# Patient Record
Sex: Female | Born: 1965 | Race: White | Hispanic: No | Marital: Married | Smoking: Never smoker
Health system: Southern US, Community
[De-identification: ages and names within clinical notes are randomized; demographics above are authoritative.]

## PROBLEM LIST (undated history)

## (undated) DIAGNOSIS — L719 Rosacea, unspecified: Secondary | ICD-10-CM

## (undated) DIAGNOSIS — H409 Unspecified glaucoma: Secondary | ICD-10-CM

## (undated) DIAGNOSIS — M1990 Unspecified osteoarthritis, unspecified site: Secondary | ICD-10-CM

## (undated) HISTORY — PX: NASAL SINUS SURGERY: SHX719

## (undated) HISTORY — PX: TUBAL LIGATION: SHX77

## (undated) HISTORY — PX: APPENDECTOMY: SHX54

---

## 2015-12-05 DIAGNOSIS — Z01419 Encounter for gynecological examination (general) (routine) without abnormal findings: Secondary | ICD-10-CM | POA: Insufficient documentation

## 2015-12-05 DIAGNOSIS — Z9889 Other specified postprocedural states: Secondary | ICD-10-CM | POA: Insufficient documentation

## 2015-12-05 DIAGNOSIS — Z6835 Body mass index (BMI) 35.0-35.9, adult: Secondary | ICD-10-CM | POA: Insufficient documentation

## 2016-06-12 DIAGNOSIS — Z8262 Family history of osteoporosis: Secondary | ICD-10-CM | POA: Insufficient documentation

## 2016-06-22 DIAGNOSIS — R102 Pelvic and perineal pain: Secondary | ICD-10-CM | POA: Insufficient documentation

## 2016-06-22 DIAGNOSIS — R519 Headache, unspecified: Secondary | ICD-10-CM | POA: Insufficient documentation

## 2016-06-22 DIAGNOSIS — R635 Abnormal weight gain: Secondary | ICD-10-CM | POA: Insufficient documentation

## 2016-09-17 HISTORY — PX: REDUCTION MAMMAPLASTY: SUR839

## 2017-01-17 ENCOUNTER — Other Ambulatory Visit: Payer: Self-pay | Admitting: Obstetrics & Gynecology

## 2017-01-17 DIAGNOSIS — N6489 Other specified disorders of breast: Secondary | ICD-10-CM

## 2017-01-21 ENCOUNTER — Other Ambulatory Visit: Payer: Self-pay | Admitting: Obstetrics & Gynecology

## 2017-01-21 ENCOUNTER — Ambulatory Visit
Admission: RE | Admit: 2017-01-21 | Discharge: 2017-01-21 | Disposition: A | Discharge status: Home / Self Care | Payer: Managed Care, Other (non HMO) | Source: Ambulatory Visit | Attending: Obstetrics & Gynecology | Admitting: Obstetrics & Gynecology

## 2017-01-21 DIAGNOSIS — N6489 Other specified disorders of breast: Secondary | ICD-10-CM

## 2017-02-26 ENCOUNTER — Other Ambulatory Visit: Payer: Managed Care, Other (non HMO)

## 2017-07-29 ENCOUNTER — Other Ambulatory Visit: Payer: Managed Care, Other (non HMO)

## 2017-07-29 ENCOUNTER — Ambulatory Visit: Payer: Managed Care, Other (non HMO)

## 2017-07-29 ENCOUNTER — Other Ambulatory Visit: Payer: Self-pay | Admitting: Obstetrics & Gynecology

## 2017-07-29 ENCOUNTER — Ambulatory Visit
Admission: RE | Admit: 2017-07-29 | Discharge: 2017-07-29 | Disposition: A | Discharge status: Home / Self Care | Payer: Managed Care, Other (non HMO) | Source: Ambulatory Visit | Attending: Obstetrics & Gynecology | Admitting: Obstetrics & Gynecology

## 2017-07-29 DIAGNOSIS — N6489 Other specified disorders of breast: Secondary | ICD-10-CM

## 2017-08-12 DIAGNOSIS — R928 Other abnormal and inconclusive findings on diagnostic imaging of breast: Secondary | ICD-10-CM | POA: Insufficient documentation

## 2017-08-15 ENCOUNTER — Ambulatory Visit: Payer: Self-pay | Admitting: Plastic Surgery

## 2017-08-22 ENCOUNTER — Encounter (HOSPITAL_BASED_OUTPATIENT_CLINIC_OR_DEPARTMENT_OTHER): Payer: Self-pay | Admitting: *Deleted

## 2017-08-28 ENCOUNTER — Other Ambulatory Visit: Payer: Self-pay

## 2017-08-28 ENCOUNTER — Ambulatory Visit (HOSPITAL_BASED_OUTPATIENT_CLINIC_OR_DEPARTMENT_OTHER): Payer: Managed Care, Other (non HMO) | Admitting: Certified Registered"

## 2017-08-28 ENCOUNTER — Ambulatory Visit (HOSPITAL_BASED_OUTPATIENT_CLINIC_OR_DEPARTMENT_OTHER)
Admission: RE | Admit: 2017-08-28 | Discharge: 2017-08-28 | Disposition: A | Discharge status: Home / Self Care | Payer: Managed Care, Other (non HMO) | Source: Ambulatory Visit | Attending: Plastic Surgery | Admitting: Plastic Surgery

## 2017-08-28 ENCOUNTER — Encounter (HOSPITAL_BASED_OUTPATIENT_CLINIC_OR_DEPARTMENT_OTHER)
Admission: RE | Disposition: A | Discharge status: Home / Self Care | Payer: Self-pay | Source: Ambulatory Visit | Attending: Plastic Surgery

## 2017-08-28 ENCOUNTER — Encounter (HOSPITAL_BASED_OUTPATIENT_CLINIC_OR_DEPARTMENT_OTHER): Payer: Self-pay | Admitting: Certified Registered"

## 2017-08-28 DIAGNOSIS — Z6834 Body mass index (BMI) 34.0-34.9, adult: Secondary | ICD-10-CM | POA: Insufficient documentation

## 2017-08-28 DIAGNOSIS — Z91018 Allergy to other foods: Secondary | ICD-10-CM | POA: Diagnosis not present

## 2017-08-28 DIAGNOSIS — N62 Hypertrophy of breast: Secondary | ICD-10-CM | POA: Insufficient documentation

## 2017-08-28 DIAGNOSIS — Z880 Allergy status to penicillin: Secondary | ICD-10-CM | POA: Insufficient documentation

## 2017-08-28 HISTORY — DX: Unspecified glaucoma: H40.9

## 2017-08-28 HISTORY — PX: BREAST REDUCTION SURGERY: SHX8

## 2017-08-28 HISTORY — PX: LIPOSUCTION: SHX10

## 2017-08-28 LAB — POCT HEMOGLOBIN-HEMACUE: HEMOGLOBIN: 15.2 g/dL — AB (ref 12.0–15.0)

## 2017-08-28 SURGERY — MAMMOPLASTY, REDUCTION
Anesthesia: General | Site: Breast | Laterality: Bilateral

## 2017-08-28 MED ORDER — BACITRACIN ZINC 500 UNIT/GM EX OINT
TOPICAL_OINTMENT | CUTANEOUS | Status: AC
Start: 2017-08-28 — End: ?
  Filled 2017-08-28: qty 28.35

## 2017-08-28 MED ORDER — PROPOFOL 10 MG/ML IV BOLUS
INTRAVENOUS | Status: DC | PRN
  Administered 2017-08-28: 200 mg via INTRAVENOUS

## 2017-08-28 MED ORDER — CEFAZOLIN SODIUM-DEXTROSE 2-4 GM/100ML-% IV SOLN
2.0000 g | INTRAVENOUS | Status: AC
  Administered 2017-08-28: 2 g via INTRAVENOUS

## 2017-08-28 MED ORDER — FENTANYL CITRATE (PF) 100 MCG/2ML IJ SOLN
50.0000 ug | INTRAMUSCULAR | Status: AC | PRN
  Administered 2017-08-28: 50 ug via INTRAVENOUS
  Administered 2017-08-28: 25 ug via INTRAVENOUS
  Administered 2017-08-28: 50 ug via INTRAVENOUS
  Administered 2017-08-28: 25 ug via INTRAVENOUS
  Administered 2017-08-28: 100 ug via INTRAVENOUS
  Administered 2017-08-28: 50 ug via INTRAVENOUS

## 2017-08-28 MED ORDER — ROCURONIUM BROMIDE 10 MG/ML (PF) SYRINGE
PREFILLED_SYRINGE | INTRAVENOUS | Status: AC
  Filled 2017-08-28: qty 10

## 2017-08-28 MED ORDER — BUPIVACAINE HCL (PF) 0.5 % IJ SOLN
INTRAMUSCULAR | Status: AC
  Filled 2017-08-28: qty 30

## 2017-08-28 MED ORDER — CEFAZOLIN SODIUM-DEXTROSE 2-4 GM/100ML-% IV SOLN
INTRAVENOUS | Status: AC
  Filled 2017-08-28: qty 100

## 2017-08-28 MED ORDER — FENTANYL CITRATE (PF) 100 MCG/2ML IJ SOLN
INTRAMUSCULAR | Status: AC
  Filled 2017-08-28: qty 2

## 2017-08-28 MED ORDER — METOCLOPRAMIDE HCL 5 MG/ML IJ SOLN: 10.0000 mg | Freq: Once | INTRAMUSCULAR | Status: DC | PRN

## 2017-08-28 MED ORDER — LACTATED RINGERS IV SOLN
INTRAVENOUS | Status: DC
  Administered 2017-08-28 (×4): via INTRAVENOUS

## 2017-08-28 MED ORDER — ONDANSETRON HCL 4 MG/2ML IJ SOLN
INTRAMUSCULAR | Status: DC | PRN
  Administered 2017-08-28: 4 mg via INTRAVENOUS

## 2017-08-28 MED ORDER — EPHEDRINE 5 MG/ML INJ
INTRAVENOUS | Status: AC
  Filled 2017-08-28: qty 10

## 2017-08-28 MED ORDER — SODIUM CHLORIDE 0.9 % IJ SOLN
INTRAMUSCULAR | Status: DC | PRN
  Administered 2017-08-28: 50 mL via INTRAVENOUS

## 2017-08-28 MED ORDER — CHLORHEXIDINE GLUCONATE CLOTH 2 % EX PADS: 6.0000 | MEDICATED_PAD | Freq: Once | CUTANEOUS | Status: DC

## 2017-08-28 MED ORDER — LIDOCAINE-EPINEPHRINE 1 %-1:100000 IJ SOLN
INTRAMUSCULAR | Status: AC
  Filled 2017-08-28: qty 2

## 2017-08-28 MED ORDER — LIDOCAINE HCL (PF) 1 % IJ SOLN
INTRAMUSCULAR | Status: AC
  Filled 2017-08-28: qty 30

## 2017-08-28 MED ORDER — DEXAMETHASONE SODIUM PHOSPHATE 10 MG/ML IJ SOLN
INTRAMUSCULAR | Status: AC
  Filled 2017-08-28: qty 2

## 2017-08-28 MED ORDER — FENTANYL CITRATE (PF) 100 MCG/2ML IJ SOLN
25.0000 ug | INTRAMUSCULAR | Status: DC | PRN
  Administered 2017-08-28 (×2): 25 ug via INTRAVENOUS

## 2017-08-28 MED ORDER — PHENYLEPHRINE 40 MCG/ML (10ML) SYRINGE FOR IV PUSH (FOR BLOOD PRESSURE SUPPORT)
PREFILLED_SYRINGE | INTRAVENOUS | Status: AC
  Filled 2017-08-28: qty 10

## 2017-08-28 MED ORDER — SCOPOLAMINE 1 MG/3DAYS TD PT72: 1.0000 | MEDICATED_PATCH | Freq: Once | TRANSDERMAL | Status: DC | PRN

## 2017-08-28 MED ORDER — MEPERIDINE HCL 25 MG/ML IJ SOLN: 6.2500 mg | INTRAMUSCULAR | Status: DC | PRN

## 2017-08-28 MED ORDER — SODIUM CHLORIDE 0.9 % IJ SOLN
INTRAMUSCULAR | Status: DC | PRN
  Administered 2017-08-28: 60 mL

## 2017-08-28 MED ORDER — MIDAZOLAM HCL 2 MG/2ML IJ SOLN
INTRAMUSCULAR | Status: AC
  Filled 2017-08-28: qty 2

## 2017-08-28 MED ORDER — SODIUM CHLORIDE 0.9 % IJ SOLN
INTRAMUSCULAR | Status: AC
  Filled 2017-08-28: qty 10

## 2017-08-28 MED ORDER — SUGAMMADEX SODIUM 200 MG/2ML IV SOLN
INTRAVENOUS | Status: AC
  Filled 2017-08-28: qty 2

## 2017-08-28 MED ORDER — BUPIVACAINE LIPOSOME 1.3 % IJ SUSP
INTRAMUSCULAR | Status: AC
  Filled 2017-08-28: qty 20

## 2017-08-28 MED ORDER — MIDAZOLAM HCL 2 MG/2ML IJ SOLN
1.0000 mg | INTRAMUSCULAR | Status: DC | PRN
  Administered 2017-08-28: 2 mg via INTRAVENOUS

## 2017-08-28 MED ORDER — ARTIFICIAL TEARS OPHTHALMIC OINT
TOPICAL_OINTMENT | OPHTHALMIC | Status: AC
  Filled 2017-08-28: qty 10.5

## 2017-08-28 MED ORDER — BACITRACIN ZINC 500 UNIT/GM EX OINT
TOPICAL_OINTMENT | CUTANEOUS | Status: DC | PRN
  Administered 2017-08-28: 1 via TOPICAL

## 2017-08-28 MED ORDER — ONDANSETRON HCL 4 MG/2ML IJ SOLN
INTRAMUSCULAR | Status: AC
  Filled 2017-08-28: qty 8

## 2017-08-28 MED ORDER — LACTATED RINGERS IV SOLN: INTRAVENOUS | Status: DC

## 2017-08-28 MED ORDER — EPHEDRINE SULFATE 50 MG/ML IJ SOLN
INTRAMUSCULAR | Status: DC | PRN
  Administered 2017-08-28 (×3): 10 mg via INTRAVENOUS

## 2017-08-28 MED ORDER — ROCURONIUM BROMIDE 100 MG/10ML IV SOLN
INTRAVENOUS | Status: DC | PRN
  Administered 2017-08-28: 60 mg via INTRAVENOUS

## 2017-08-28 MED ORDER — BUPIVACAINE HCL (PF) 0.25 % IJ SOLN
INTRAMUSCULAR | Status: DC | PRN
  Administered 2017-08-28: 30 mL

## 2017-08-28 MED ORDER — EPINEPHRINE 30 MG/30ML IJ SOLN
INTRAMUSCULAR | Status: AC
  Filled 2017-08-28: qty 1

## 2017-08-28 MED ORDER — BUPIVACAINE HCL (PF) 0.25 % IJ SOLN
INTRAMUSCULAR | Status: AC
  Filled 2017-08-28: qty 30

## 2017-08-28 MED ORDER — LIDOCAINE-EPINEPHRINE 1 %-1:100000 IJ SOLN
INTRAMUSCULAR | Status: DC | PRN
  Administered 2017-08-28: 20 mL

## 2017-08-28 MED ORDER — SODIUM BICARBONATE 4 % IV SOLN
INTRAVENOUS | Status: DC | PRN
  Administered 2017-08-28: 1500 mL via INTRAMUSCULAR

## 2017-08-28 MED ORDER — SUGAMMADEX SODIUM 200 MG/2ML IV SOLN
INTRAVENOUS | Status: DC | PRN
  Administered 2017-08-28: 200 mg via INTRAVENOUS

## 2017-08-28 MED ORDER — LIDOCAINE 2% (20 MG/ML) 5 ML SYRINGE
INTRAMUSCULAR | Status: AC
  Filled 2017-08-28: qty 25

## 2017-08-28 MED ORDER — LIDOCAINE HCL (CARDIAC) 20 MG/ML IV SOLN
INTRAVENOUS | Status: DC | PRN
  Administered 2017-08-28: 60 mg via INTRAVENOUS

## 2017-08-28 MED ORDER — DEXAMETHASONE SODIUM PHOSPHATE 4 MG/ML IJ SOLN
INTRAMUSCULAR | Status: DC | PRN
  Administered 2017-08-28: 10 mg via INTRAVENOUS

## 2017-08-28 MED ORDER — PROPOFOL 500 MG/50ML IV EMUL
INTRAVENOUS | Status: AC
  Filled 2017-08-28: qty 100

## 2017-08-28 MED ORDER — PHENYLEPHRINE 40 MCG/ML (10ML) SYRINGE FOR IV PUSH (FOR BLOOD PRESSURE SUPPORT)
PREFILLED_SYRINGE | INTRAVENOUS | Status: AC
  Filled 2017-08-28: qty 20

## 2017-08-28 SURGICAL SUPPLY — 64 items
BAG DECANTER FOR FLEXI CONT (MISCELLANEOUS) ×3 IMPLANT
BENZOIN TINCTURE PRP APPL 2/3 (GAUZE/BANDAGES/DRESSINGS) ×6 IMPLANT
BLADE KNIFE PERSONA 10 (BLADE) ×12 IMPLANT
BLADE KNIFE PERSONA 15 (BLADE) ×9 IMPLANT
BNDG GAUZE ELAST 4 BULKY (GAUZE/BANDAGES/DRESSINGS) ×6 IMPLANT
CANISTER SUCT 1200ML W/VALVE (MISCELLANEOUS) ×3 IMPLANT
CAP BOUFFANT 24 BLUE NURSES (PROTECTIVE WEAR) ×3 IMPLANT
CLOSURE STERI-STRIP 1/2X4 (GAUZE/BANDAGES/DRESSINGS) ×2
CLOSURE WOUND 1/2 X4 (GAUZE/BANDAGES/DRESSINGS) ×4
CLSR STERI-STRIP ANTIMIC 1/2X4 (GAUZE/BANDAGES/DRESSINGS) ×4 IMPLANT
COVER BACK TABLE 60X90IN (DRAPES) ×3 IMPLANT
COVER MAYO STAND STRL (DRAPES) ×3 IMPLANT
DECANTER SPIKE VIAL GLASS SM (MISCELLANEOUS) ×6 IMPLANT
DRAIN CHANNEL 10F 3/8 F FF (DRAIN) ×6 IMPLANT
DRAPE LAPAROSCOPIC ABDOMINAL (DRAPES) ×3 IMPLANT
DRAPE U-SHAPE 76X120 STRL (DRAPES) ×6 IMPLANT
DRSG EMULSION OIL 3X3 NADH (GAUZE/BANDAGES/DRESSINGS) ×6 IMPLANT
DRSG PAD ABDOMINAL 8X10 ST (GAUZE/BANDAGES/DRESSINGS) ×6 IMPLANT
ELECT REM PT RETURN 9FT ADLT (ELECTROSURGICAL) ×3
ELECTRODE REM PT RTRN 9FT ADLT (ELECTROSURGICAL) ×1 IMPLANT
EVACUATOR SILICONE 100CC (DRAIN) ×6 IMPLANT
FILTER 7/8 IN (FILTER) IMPLANT
GAUZE SPONGE 4X4 12PLY STRL (GAUZE/BANDAGES/DRESSINGS) ×6 IMPLANT
GLOVE BIO SURGEON STRL SZ 6.5 (GLOVE) ×6 IMPLANT
GLOVE BIO SURGEON STRL SZ7 (GLOVE) ×6 IMPLANT
GLOVE BIO SURGEONS STRL SZ 6.5 (GLOVE) ×3
GOWN STRL REUS W/ TWL LRG LVL3 (GOWN DISPOSABLE) ×2 IMPLANT
GOWN STRL REUS W/TWL LRG LVL3 (GOWN DISPOSABLE) ×4
IV NS 250ML (IV SOLUTION) ×2
IV NS 250ML BAXH (IV SOLUTION) ×1 IMPLANT
NDL SAFETY ECLIPSE 18X1.5 (NEEDLE) ×1 IMPLANT
NEEDLE HYPO 18GX1.5 SHARP (NEEDLE) ×2
NEEDLE HYPO 25X1 1.5 SAFETY (NEEDLE) ×9 IMPLANT
NEEDLE SPNL 18GX3.5 QUINCKE PK (NEEDLE) ×3 IMPLANT
NS IRRIG 1000ML POUR BTL (IV SOLUTION) ×6 IMPLANT
PACK BASIN DAY SURGERY FS (CUSTOM PROCEDURE TRAY) ×3 IMPLANT
PIN SAFETY STERILE (MISCELLANEOUS) ×3 IMPLANT
SCRUB TECHNI CARE 4 OZ NO DYE (MISCELLANEOUS) ×3 IMPLANT
SLEEVE SCD COMPRESS KNEE MED (MISCELLANEOUS) ×3 IMPLANT
SPECIMEN JAR MEDIUM (MISCELLANEOUS) ×6 IMPLANT
SPECIMEN JAR X LARGE (MISCELLANEOUS) IMPLANT
SPONGE LAP 18X18 X RAY DECT (DISPOSABLE) ×15 IMPLANT
STAPLER VISISTAT 35W (STAPLE) ×6 IMPLANT
STRIP CLOSURE SKIN 1/2X4 (GAUZE/BANDAGES/DRESSINGS) ×8 IMPLANT
SUT ETHILON 3 0 PS 1 (SUTURE) ×3 IMPLANT
SUT MNCRL AB 3-0 PS2 18 (SUTURE) ×6 IMPLANT
SUT MNCRL AB 4-0 PS2 18 (SUTURE) ×6 IMPLANT
SUT MON AB 5-0 PS2 18 (SUTURE) ×6 IMPLANT
SUT PROLENE 2 0 CT2 30 (SUTURE) ×3 IMPLANT
SUT PROLENE 3 0 PS 1 (SUTURE) ×6 IMPLANT
SUT VLOC 90 P-14 23 (SUTURE) ×6 IMPLANT
SYR BULB IRRIGATION 50ML (SYRINGE) ×6 IMPLANT
SYR CONTROL 10ML LL (SYRINGE) ×6 IMPLANT
TAPE MEASURE VINYL STERILE (MISCELLANEOUS) ×3 IMPLANT
TOWEL OR 17X24 6PK STRL BLUE (TOWEL DISPOSABLE) ×9 IMPLANT
TOWEL OR NON WOVEN STRL DISP B (DISPOSABLE) IMPLANT
TRAY DSU PREP LF (CUSTOM PROCEDURE TRAY) ×3 IMPLANT
TRAY FOLEY BAG SILVER LF 14FR (SET/KITS/TRAYS/PACK) ×3 IMPLANT
TRAY FOLEY BAG SILVER LF 16FR (SET/KITS/TRAYS/PACK) IMPLANT
TUBE CONNECTING 20'X1/4 (TUBING) ×1
TUBE CONNECTING 20X1/4 (TUBING) ×2 IMPLANT
UNDERPAD 30X30 (UNDERPADS AND DIAPERS) ×6 IMPLANT
VAC PENCILS W/TUBING CLEAR (MISCELLANEOUS) ×3 IMPLANT
YANKAUER SUCT BULB TIP NO VENT (SUCTIONS) ×3 IMPLANT

## 2017-08-28 NOTE — Transfer of Care (Signed)
Immediate Anesthesia Transfer of Care Note  Patient: Kimberly Brewer  Procedure(s) Performed: BILATERAL MAMMARY REDUCTION  (BREAST) (Bilateral Breast) LIPOSUCTION (Bilateral Breast)  Patient Location: PACU  Anesthesia Type:General  Level of Consciousness: awake, alert , oriented and patient cooperative  Airway & Oxygen Therapy: Patient Spontanous Breathing and Patient connected to face mask oxygen  Post-op Assessment: Report given to RN and Post -op Vital signs reviewed and stable  Post vital signs: Reviewed and stable  Last Vitals:  Vitals:   08/28/17 0743  BP: 124/73  Pulse: 66  Resp: 18  Temp: 36.5 C  SpO2: 98%    Last Pain:  Vitals:   08/28/17 0743  TempSrc: Oral         Complications: No apparent anesthesia complications

## 2017-08-28 NOTE — Anesthesia Preprocedure Evaluation (Addendum)
Anesthesia Evaluation  Patient identified by MRN, date of birth, ID band Patient awake    Reviewed: Allergy & Precautions, NPO status , Patient's Chart, lab work & pertinent test results  Airway Mallampati: II  TM Distance: >3 FB Neck ROM: Full    Dental no notable dental hx.    Pulmonary neg pulmonary ROS,    Pulmonary exam normal breath sounds clear to auscultation       Cardiovascular negative cardio ROS Normal cardiovascular exam Rhythm:Regular Rate:Normal     Neuro/Psych negative neurological ROS  negative psych ROS   GI/Hepatic negative GI ROS, Neg liver ROS,   Endo/Other  Morbid obesity  Renal/GU negative Renal ROS  negative genitourinary   Musculoskeletal negative musculoskeletal ROS (+)   Abdominal   Peds negative pediatric ROS (+)  Hematology negative hematology ROS (+)   Anesthesia Other Findings   Reproductive/Obstetrics negative OB ROS                            Anesthesia Physical Anesthesia Plan  ASA: II  Anesthesia Plan: General   Post-op Pain Management:    Induction: Intravenous  PONV Risk Score and Plan: 3  Airway Management Planned: LMA and Oral ETT  Additional Equipment:   Intra-op Plan:   Post-operative Plan: Extubation in OR  Informed Consent: I have reviewed the patients History and Physical, chart, labs and discussed the procedure including the risks, benefits and alternatives for the proposed anesthesia with the patient or authorized representative who has indicated his/her understanding and acceptance.   Dental advisory given  Plan Discussed with: CRNA  Anesthesia Plan Comments:         Anesthesia Quick Evaluation

## 2017-08-28 NOTE — Brief Op Note (Signed)
08/28/2017  2:00 PM  PATIENT:  Kimberly Brewer  51 y.o. female  PRE-OPERATIVE DIAGNOSIS:  BILATERAL MACROMASTIA  POST-OPERATIVE DIAGNOSIS:  BILATERAL MACROMASTIA  PROCEDURE:  Procedure(s): BILATERAL MAMMARY REDUCTION  (BREAST) (Bilateral) LIPOSUCTION (Bilateral)  SURGEON:  Surgeon(s) and Role:    * Contogiannis, Chales AbrahamsMary Ann, MD - Primary   ANESTHESIA:   general  EBL:  175 mL   BLOOD ADMINISTERED:none  DRAINS: (65F) Jackson-Pratt drain(s) with closed bulb suction in the Bilateral Breasts   LOCAL MEDICATIONS USED:  1.35 Exparel (266 mgs.total)  SPECIMEN:  Source of Specimen:  Bilateral Breasts  DISPOSITION OF SPECIMEN:  PATHOLOGY  COUNTS:  YES  DICTATION: .Note written in EPIC  PLAN OF CARE: Discharge to home after PACU  PATIENT DISPOSITION:  PACU - hemodynamically stable.   Delay start of Pharmacological VTE agent (>24hrs) due to surgical blood loss or risk of bleeding: not applicable

## 2017-08-28 NOTE — Discharge Instructions (Signed)
1. No lifting greater than 5 lbs with arms for 4 weeks. 2. Empty, strip, record and reactivate JP drains 3 times a day. 3. Percocet 5/325 mg tabs 1-2 tabs po q 4-6 hours prn pain- prescription given in office. 4. Duricef 1 tab po bid- prescription given in office. 5. Sterapred dose pack as directed- prescription given in office. 6. Follow-up appointment in a few days in office.    Post Anesthesia Home Care Instructions  Activity: Get plenty of rest for the remainder of the day. A responsible individual must stay with you for 24 hours following the procedure.  For the next 24 hours, DO NOT: -Drive a car -Advertising copywriterperate machinery -Drink alcoholic beverages -Take any medication unless instructed by your physician -Make any legal decisions or sign important papers.  Meals: Start with liquid foods such as gelatin or soup. Progress to regular foods as tolerated. Avoid greasy, spicy, heavy foods. If nausea and/or vomiting occur, drink only clear liquids until the nausea and/or vomiting subsides. Call your physician if vomiting continues.  Special Instructions/Symptoms: Your throat may feel dry or sore from the anesthesia or the breathing tube placed in your throat during surgery. If this causes discomfort, gargle with warm salt water. The discomfort should disappear within 24 hours.  If you had a scopolamine patch placed behind your ear for the management of post- operative nausea and/or vomiting:  1. The medication in the patch is effective for 72 hours, after which it should be removed.  Wrap patch in a tissue and discard in the trash. Wash hands thoroughly with soap and water. 2. You may remove the patch earlier than 72 hours if you experience unpleasant side effects which may include dry mouth, dizziness or visual disturbances. 3. Avoid touching the patch. Wash your hands with soap and water after contact with the patch.  About my Jackson-Pratt Bulb Drain  What is a Jackson-Pratt bulb? A  Jackson-Pratt is a soft, round device used to collect drainage. It is connected to a long, thin drainage catheter, which is held in place by one or two small stiches near your surgical incision site. When the bulb is squeezed, it forms a vacuum, forcing the drainage to empty into the bulb.  Emptying the Jackson-Pratt bulb- To empty the bulb: 1. Release the plug on the top of the bulb. 2. Pour the bulb's contents into a measuring container which your nurse will provide. 3. Record the time emptied and amount of drainage. Empty the drain(s) as often as your     doctor or nurse recommends.  Date                  Time                    Amount (Drain 1)                 Amount (Drain 2)  _____________________________________________________________________  _____________________________________________________________________  _____________________________________________________________________  _____________________________________________________________________  _____________________________________________________________________  _____________________________________________________________________  _____________________________________________________________________  _____________________________________________________________________  Squeezing the Jackson-Pratt Bulb- To squeeze the bulb: 1. Make sure the plug at the top of the bulb is open. 2. Squeeze the bulb tightly in your fist. You will hear air squeezing from the bulb. 3. Replace the plug while the bulb is squeezed. 4. Use a safety pin to attach the bulb to your clothing. This will keep the catheter from     pulling at the bulb insertion site.  When to call your doctor- Call your doctor if:  Drain site becomes red, swollen or hot.  You have a fever greater than 101 degrees F.  There is oozing at the drain site.  Drain falls out (apply a guaze bandage over the drain hole and secure it with tape).  Drainage  increases daily not related to activity patterns. (You will usually have more drainage when you are active than when you are resting.)  Drainage has a bad odor.

## 2017-08-28 NOTE — H&P (Signed)
  H&P faxed to surgical center.  -History and Physical Reviewed  -Patient has been re-examined  -No change in the plan of care  Tamaka Sawin A    

## 2017-08-28 NOTE — Anesthesia Postprocedure Evaluation (Signed)
Anesthesia Post Note  Patient: Marval RegalSharon Horney  Procedure(s) Performed: BILATERAL MAMMARY REDUCTION  (BREAST) (Bilateral Breast) LIPOSUCTION (Bilateral Breast)     Patient location during evaluation: PACU Anesthesia Type: General Level of consciousness: awake and alert Pain management: pain level controlled Vital Signs Assessment: post-procedure vital signs reviewed and stable Respiratory status: spontaneous breathing, nonlabored ventilation, respiratory function stable and patient connected to nasal cannula oxygen Cardiovascular status: blood pressure returned to baseline and stable Postop Assessment: no apparent nausea or vomiting Anesthetic complications: no    Last Vitals:  Vitals:   08/28/17 1500 08/28/17 1515  BP: 116/61 118/63  Pulse: 98 95  Resp: 15 17  Temp:    SpO2: 98% 96%    Last Pain:  Vitals:   08/28/17 1500  TempSrc:   PainSc: Asleep                 Phillips Groutarignan, Audreanna Torrisi

## 2017-08-28 NOTE — Anesthesia Procedure Notes (Signed)
Procedure Name: Intubation Date/Time: 08/28/2017 8:50 AM Performed by: Signe Colt, CRNA Pre-anesthesia Checklist: Patient identified, Emergency Drugs available, Suction available and Patient being monitored Patient Re-evaluated:Patient Re-evaluated prior to induction Oxygen Delivery Method: Circle system utilized Preoxygenation: Pre-oxygenation with 100% oxygen Induction Type: IV induction Ventilation: Mask ventilation without difficulty Laryngoscope Size: Mac and 3 Grade View: Grade I Tube type: Oral Tube size: 7.0 mm Number of attempts: 1 Airway Equipment and Method: Stylet and Oral airway Placement Confirmation: ETT inserted through vocal cords under direct vision,  positive ETCO2 and breath sounds checked- equal and bilateral Secured at: 21 cm Tube secured with: Tape Dental Injury: Teeth and Oropharynx as per pre-operative assessment  Comments: Airway assessment - receeding chin, narrow palate, native teeth - DL x 1 with Mac 3 grade ! view

## 2017-08-28 NOTE — Op Note (Signed)
OPERATIVE REPORT  08/28/2017  Kimberly Brewer  PREOPERATIVE DIAGNOSIS:  Bilateral macromastia.  POSTOPERATIVE DIAGNOSIS:  Bilateral macromastia.  PROCEDURE:  Bilateral reduction mammoplasties.  ATTENDING SURGEON:  Kimberly LevelsMary Ann Athanasius Kesling, MD  ANESTHESIA:  General.  ANESTHESIOLOGIST:  , MD  COMPLICATIONS:  None.  INDICATIONS FOR THE PROCEDURE:  The patient is a 51 y.o. female who has bilateral macromastia that is clinically symptomatic.  She presents to undergo bilateral reduction mammoplasties.  DESCRIPTION OF PROCEDURE:  The patient was marked in preop holding area in a pattern of Wise for the future bilateral reduction mammoplasties. She was then taken back to the OR, placed on the table in supine position.  After adequate general anesthesia was obtained, the patient's chest was prepped with Techni-Care and draped in sterile fashion.  The bases of the breasts have been infiltrated with 1% lidocaine with epinephrine.  After adequate hemostasis and anesthesia taken effect, the procedure was begun.  Both of the breast reductions were performed in the following similar manner.  The nipple-areolar complex was marked with a 45-mm nipple marker.  The skin was then incised and deepithelialized around the nipple-areolar complex down to the inframammary crease in the inferior pedicle pattern.  Next, the medial, superior, and lateral skin flaps were elevated down to the chest wall.  Excess fat and glandular tissue removed from the inferior pedicle.  The nipple-areolar complex was examined and found to be pink and viable. Power-assisted liposuction of the lateral breasts was performed to remove the excess breast and not have to extend the [facility] incision. The amounts are as noted on the accompanying nurses notes and the tissue was included with each specimen as appropriate. The wound was irrigated with saline irrigation.  Meticulous hemostasis was obtained with the Bovie electrocautery.   Inferior pedicle was centralized using 3-0 Prolene suture.  A #10 JP flat fully fluted drain was placed into the wound. The skin flaps were brought together at the inverted T junction with a 2- 0 Prolene suture.  The incisions were stapled for temporary closure. The breasts compared and found to have good shape and symmetry.  The incisions were then closed from the medial aspect of the JP drain to the medial aspect of the [facility] incision by first placing a few 3-0 Monocryl sutures to tack together the dermal layer, and then both the dermal and cuticular layer were closed in a single layer using a 2-0 Quill PDO barbed suture.  Lateral to the JP drain incision was closed using 3-0 Monocryl in the dermal layer, followed by 3-0 Monocryl running intracuticular stitch on the skin.  The vertical limb of the Wise pattern was closed in the dermal layer using 3-0 Monocryl suture.  The patient was placed in the upright position.  The future location of the nipple-areolar complexes was marked on both breast mounds using the 45-mm nipple marker.  She was then placed back in the recumbent position.  Both of the nipple areolar complexes were brought out onto the breast mounds in the following similar manner.  The skin was incised as marked and removed in full thickness into the subcutaneous tissues.  The nipple- areolar complex was examined, found to be pink and viable, then brought out through this aperture and sewn in place using 4-0 Monocryl in the dermal layer, followed by 5-0 Monocryl running intracuticular stitch on the skin.  This 5-0 Monocryl suture was then brought down to close the cuticular layer of the vertical limb as well.  The JP drain was  sewn in place using 3-0 nylon suture.  The pectoralis major muscle and fascia along with the breast and chest soft tissues were then infiltrated with 1% Exparel (total 266 mg).  Now the [facility] incision was also infiltrated with the Exparel in order to give  the patient postoperative pain control.  The incisions were dressed with benzoin, Steri-Strips, and the nipples dressed with bacitracin ointment and Adaptic.  4x4s were placed over the incisions and ABD pads in the axillary areas.  The patient was placed into a light postoperative support bra.  There were no complications. The patient tolerated the procedure well.  The final needle, sponge counts were reported to be correct at the end of the case.  The patient was then recovered without complications.  Both the patient and her family were given proper postoperative wound care instructions. She was then discharged home in the care of her family in stable condition.  Follow up will be with me in a few days in the office.         Kimberly LevelsMary Ann Durwood Brewer, M.D.  08/28/2017 2:03 PM

## 2017-08-29 ENCOUNTER — Encounter (HOSPITAL_BASED_OUTPATIENT_CLINIC_OR_DEPARTMENT_OTHER): Payer: Self-pay | Admitting: Plastic Surgery

## 2018-01-27 ENCOUNTER — Ambulatory Visit
Admission: RE | Admit: 2018-01-27 | Discharge: 2018-01-27 | Disposition: A | Discharge status: Home / Self Care | Payer: Managed Care, Other (non HMO) | Source: Ambulatory Visit | Attending: Obstetrics & Gynecology | Admitting: Obstetrics & Gynecology

## 2018-01-27 DIAGNOSIS — N6489 Other specified disorders of breast: Secondary | ICD-10-CM

## 2018-11-10 ENCOUNTER — Other Ambulatory Visit: Payer: Self-pay | Admitting: Obstetrics & Gynecology

## 2018-11-10 DIAGNOSIS — N6489 Other specified disorders of breast: Secondary | ICD-10-CM

## 2019-01-29 ENCOUNTER — Other Ambulatory Visit: Payer: Self-pay | Admitting: Obstetrics & Gynecology

## 2019-01-29 ENCOUNTER — Ambulatory Visit
Admission: RE | Admit: 2019-01-29 | Discharge: 2019-01-29 | Disposition: A | Discharge status: Home / Self Care | Payer: 59 | Source: Ambulatory Visit | Attending: Obstetrics & Gynecology | Admitting: Obstetrics & Gynecology

## 2019-01-29 ENCOUNTER — Other Ambulatory Visit: Payer: Self-pay

## 2019-01-29 DIAGNOSIS — R921 Mammographic calcification found on diagnostic imaging of breast: Secondary | ICD-10-CM

## 2019-01-29 DIAGNOSIS — N6489 Other specified disorders of breast: Secondary | ICD-10-CM

## 2019-01-29 DIAGNOSIS — N6320 Unspecified lump in the left breast, unspecified quadrant: Secondary | ICD-10-CM

## 2019-02-06 ENCOUNTER — Ambulatory Visit
Admission: RE | Admit: 2019-02-06 | Discharge: 2019-02-06 | Disposition: A | Discharge status: Home / Self Care | Payer: 59 | Source: Ambulatory Visit | Attending: Obstetrics & Gynecology | Admitting: Obstetrics & Gynecology

## 2019-02-06 ENCOUNTER — Other Ambulatory Visit: Payer: Self-pay | Admitting: Obstetrics & Gynecology

## 2019-02-06 ENCOUNTER — Other Ambulatory Visit: Payer: Self-pay

## 2019-02-06 DIAGNOSIS — N6320 Unspecified lump in the left breast, unspecified quadrant: Secondary | ICD-10-CM

## 2019-02-16 ENCOUNTER — Other Ambulatory Visit: Payer: Self-pay | Admitting: Surgery

## 2019-02-16 DIAGNOSIS — N6320 Unspecified lump in the left breast, unspecified quadrant: Secondary | ICD-10-CM

## 2019-02-20 ENCOUNTER — Other Ambulatory Visit: Payer: Self-pay | Admitting: Surgery

## 2019-02-20 DIAGNOSIS — N6320 Unspecified lump in the left breast, unspecified quadrant: Secondary | ICD-10-CM

## 2019-04-02 ENCOUNTER — Other Ambulatory Visit: Payer: Self-pay

## 2019-04-02 ENCOUNTER — Encounter (HOSPITAL_BASED_OUTPATIENT_CLINIC_OR_DEPARTMENT_OTHER): Payer: Self-pay

## 2019-04-06 ENCOUNTER — Other Ambulatory Visit (HOSPITAL_COMMUNITY)
Admission: RE | Admit: 2019-04-06 | Discharge: 2019-04-06 | Disposition: A | Discharge status: Home / Self Care | Payer: 59 | Source: Ambulatory Visit | Attending: Surgery | Admitting: Surgery

## 2019-04-06 DIAGNOSIS — Z1159 Encounter for screening for other viral diseases: Secondary | ICD-10-CM | POA: Diagnosis present

## 2019-04-06 DIAGNOSIS — N6320 Unspecified lump in the left breast, unspecified quadrant: Secondary | ICD-10-CM

## 2019-04-07 LAB — SARS CORONAVIRUS 2 (TAT 6-24 HRS): SARS Coronavirus 2: NEGATIVE

## 2019-04-08 ENCOUNTER — Ambulatory Visit
Admission: RE | Admit: 2019-04-08 | Discharge: 2019-04-08 | Disposition: A | Discharge status: Home / Self Care | Payer: 59 | Source: Ambulatory Visit | Attending: Surgery | Admitting: Surgery

## 2019-04-08 ENCOUNTER — Other Ambulatory Visit: Payer: Self-pay

## 2019-04-08 DIAGNOSIS — N6320 Unspecified lump in the left breast, unspecified quadrant: Secondary | ICD-10-CM

## 2019-04-08 NOTE — H&P (Signed)
Kimberly Brewer  [facility] Patient #: [id number] DOB: [birth year] Married / Language: English / Race: White Female   History of Present Illness The patient is a 53 year old female who presents with a breast mass. This is a pleasant 53 year old female referred by Dr. Malachi CarlJill Wagner after the recent diagnosis of a left breast mass seen on screening mammography. She had a previous bilateral breast reduction in December 2018. She underwent mammograms and ultrasounds in May of this year showing multiple benign calcifications in the right breast consistent with fat necrosis. There was, however, a focal spiculated mass measuring 1.1 cm in the outer mid to lower left breast which was also felt to be consistent with fat necrosis. She underwent a standard at a biopsy of this which was indeterminate. There were spindle cells present. This was felt to be discordant with the mammographic findings so removal of this area has been strongly recommended. She has had no previous history of breast cancer or the need for breast biopsies. There is no family history of breast cancer. She is otherwise healthy.   Past Surgical History Appendectomy  Breast Biopsy  Left. Mammoplasty; Reduction  Bilateral. Oral Surgery  Tonsillectomy   Diagnostic Studies History  Colonoscopy  5-10 years ago Mammogram  within last year Pap Smear  1-5 years ago  Allergies  Penicillins  Allergies Reconciled   Medication History (Sabrina Hills[place]anty, CMA;  Doxycycline Hyclate (20MG  Tablet, Oral) Active. Latanoprost (0.005% Solution, Ophthalmic) Active. Medications Reconciled  Social History [name]([name], CMA; 02/16/2019 2:07 PM) Caffeine use  Tea. No alcohol use  No drug use  Tobacco use  Never smoker.  Family History  Anesthetic complications  Mother. Arthritis  Father, Mother. Bleeding disorder  Mother. Heart Disease  Father. Hypertension  Mother. Kidney Disease   Mother. Melanoma  Mother.  Pregnancy / Birth History  Age at menarche  11 years. Age of menopause  1546-48 Contraceptive History  Oral contraceptives. Gravida  1 Length (months) of breastfeeding  3-6 Maternal age  55-35 Para  1 Regular periods     Review of Systems ( General Not Present- Appetite Loss, Chills, Fatigue, Fever, Night Sweats, Weight Gain and Weight Loss. Skin Not Present- Change in Wart/Mole, Dryness, Hives, Jaundice, New Lesions, Non-Healing Wounds, Rash and Ulcer. HEENT Present- Seasonal Allergies and Wears glasses/contact lenses. Not Present- Earache, Hearing Loss, Hoarseness, Nose Bleed, Oral Ulcers, Ringing in the Ears, Sinus Pain, Sore Throat, Visual Disturbances and Yellow Eyes. Respiratory Present- Snoring. Not Present- Bloody sputum, Chronic Cough, Difficulty Breathing and Wheezing. Breast Present- Breast Mass. Not Present- Breast Pain, Nipple Discharge and Skin Changes. Cardiovascular Not Present- Chest Pain, Difficulty Breathing Lying Down, Leg Cramps, Palpitations, Rapid Heart Rate, Shortness of Breath and Swelling of Extremities. Gastrointestinal Not Present- Abdominal Pain, Bloating, Bloody Stool, Change in Bowel Habits, Chronic diarrhea, Constipation, Difficulty Swallowing, Excessive gas, Gets full quickly at meals, Hemorrhoids, Indigestion, Nausea, Rectal Pain and Vomiting. Female Genitourinary Not Present- Frequency, Nocturia, Painful Urination, Pelvic Pain and Urgency. Neurological Not Present- Decreased Memory, Fainting, Headaches, Numbness, Seizures, Tingling, Tremor, Trouble walking and Weakness. Endocrine Not Present- Cold Intolerance, Excessive Hunger, Hair Changes, Heat Intolerance, Hot flashes and New Diabetes. Hematology Not Present- Blood Thinners, Easy Bruising, Excessive bleeding, Gland problems, HIV and Persistent Infections.  Vitals  Weight: 255 lb Height: 69in Body Surface Area: 2.29 m Body Mass Index: 37.66 kg/m  Temp.:  97.72F (Temporal)  Pulse: 117 (Regular)  BP: 188/92(Sitting, Left Arm, Standard)    Physical Exam  General Mental Status-Alert. General Appearance-Consistent with stated age. Hydration-Well hydrated. Voice-Normal.  Head and Neck Head-normocephalic, atraumatic with no lesions or palpable masses. Trachea-midline. Thyroid Gland Characteristics - normal size and consistency.  Eye Eyeball - Bilateral-Extraocular movements intact. Sclera/Conjunctiva - Bilateral-No scleral icterus.  Chest and Lung Exam Chest and lung exam reveals -quiet, even and easy respiratory effort with no use of accessory muscles and on auscultation, normal breath sounds, no adventitious sounds and normal vocal resonance. Inspection Chest Wall - Normal. Back - normal.  Breast Breast - Left-Symmetric, Non Tender, No Biopsy scars, no Dimpling, No Inflammation, No Lumpectomy scars, No Mastectomy scars, No Peau d' Orange. Breast - Right-Symmetric, Non Tender, No Biopsy scars, no Dimpling, No Inflammation, No Lumpectomy scars, No Mastectomy scars, No Peau d' Orange. Breast Lump-No Palpable Breast Mass. Note: There are bilateral reduction mammoplasty scars on her breasts. There are no palpable masses in either breast.   Cardiovascular Cardiovascular examination reveals -normal heart sounds, regular rate and rhythm with no murmurs and normal pedal pulses bilaterally.  Abdomen Inspection Inspection of the abdomen reveals - No Hernias. Skin - Scar - no surgical scars. Palpation/Percussion Palpation and Percussion of the abdomen reveal - Soft, Non Tender, No Rebound tenderness, No Rigidity (guarding) and No hepatosplenomegaly. Auscultation Auscultation of the abdomen reveals - Bowel sounds normal.  Neurologic - Did not examine.  Musculoskeletal - Did not examine.  Lymphatic Head & Neck  General Head & Neck Lymphatics: Bilateral - Description - Normal. Axillary  General  Axillary Region: Bilateral - Description - Normal. Tenderness - Non Tender. Femoral & Inguinal  Generalized Femoral & Inguinal Lymphatics: Bilateral - Description - Normal. Tenderness - Non Tender.    Assessment & Plan   LEFT BREAST MASS (N63.20)  Impression: I have reviewed the mammogram, ultrasound, and pathology results. I have given a copy of the pathology results with the patient. We discussed the mass seen in the left lower outer quadrant of the left breast. Because of the biopsy being discordant and the mammographic findings, removal of this area is recommended. I discussed proceeding with a left breast radioactive seed guided lumpectomy with her. I discussed the surgical procedure in detail. We discussed reasons for surgery. We discussed the risks which includes but is not limited to bleeding, infection, injury to surrounding structures, the need for further procedures if malignancy is found, cardiopulmonary issues, postoperative recovery, preoperative COVID testing, etc. She understands and wished to proceed with surgery which will be scheduled

## 2019-04-08 NOTE — Progress Notes (Signed)
Pt given CHG bath with instructions and given G2 gatorade and instructions to have completed by [place] DOS. NPO otherwise DOS. Pt verbalized understanding.

## 2019-04-09 ENCOUNTER — Other Ambulatory Visit: Payer: Self-pay

## 2019-04-09 ENCOUNTER — Encounter (HOSPITAL_BASED_OUTPATIENT_CLINIC_OR_DEPARTMENT_OTHER): Payer: Self-pay

## 2019-04-09 ENCOUNTER — Ambulatory Visit (HOSPITAL_BASED_OUTPATIENT_CLINIC_OR_DEPARTMENT_OTHER): Payer: 59 | Admitting: Certified Registered"

## 2019-04-09 ENCOUNTER — Encounter (HOSPITAL_BASED_OUTPATIENT_CLINIC_OR_DEPARTMENT_OTHER)
Admission: RE | Disposition: A | Discharge status: Home / Self Care | Payer: Self-pay | Source: Home / Self Care | Attending: Surgery

## 2019-04-09 ENCOUNTER — Ambulatory Visit (HOSPITAL_BASED_OUTPATIENT_CLINIC_OR_DEPARTMENT_OTHER)
Admission: RE | Admit: 2019-04-09 | Discharge: 2019-04-09 | Disposition: A | Discharge status: Home / Self Care | Payer: 59 | Attending: Surgery | Admitting: Surgery

## 2019-04-09 ENCOUNTER — Ambulatory Visit
Admission: RE | Admit: 2019-04-09 | Discharge: 2019-04-09 | Disposition: A | Discharge status: Home / Self Care | Payer: 59 | Source: Ambulatory Visit | Attending: Surgery | Admitting: Surgery

## 2019-04-09 DIAGNOSIS — N6323 Unspecified lump in the left breast, lower outer quadrant: Secondary | ICD-10-CM | POA: Insufficient documentation

## 2019-04-09 DIAGNOSIS — N6320 Unspecified lump in the left breast, unspecified quadrant: Secondary | ICD-10-CM

## 2019-04-09 DIAGNOSIS — Z880 Allergy status to penicillin: Secondary | ICD-10-CM | POA: Diagnosis not present

## 2019-04-09 DIAGNOSIS — Z792 Long term (current) use of antibiotics: Secondary | ICD-10-CM | POA: Insufficient documentation

## 2019-04-09 HISTORY — PX: BREAST LUMPECTOMY WITH RADIOACTIVE SEED LOCALIZATION: SHX6424

## 2019-04-09 HISTORY — DX: Rosacea, unspecified: L71.9

## 2019-04-09 SURGERY — BREAST LUMPECTOMY WITH RADIOACTIVE SEED LOCALIZATION
Anesthesia: General | Site: Breast | Laterality: Left

## 2019-04-09 MED ORDER — DEXAMETHASONE SODIUM PHOSPHATE 10 MG/ML IJ SOLN
INTRAMUSCULAR | Status: DC | PRN
  Administered 2019-04-09: 5 mg via INTRAVENOUS

## 2019-04-09 MED ORDER — BUPIVACAINE-EPINEPHRINE 0.5% -1:200000 IJ SOLN
INTRAMUSCULAR | Status: DC | PRN
  Administered 2019-04-09: 17 mL

## 2019-04-09 MED ORDER — CIPROFLOXACIN IN D5W 400 MG/200ML IV SOLN
INTRAVENOUS | Status: AC
  Filled 2019-04-09: qty 200

## 2019-04-09 MED ORDER — ONDANSETRON HCL 4 MG/2ML IJ SOLN
INTRAMUSCULAR | Status: DC | PRN
  Administered 2019-04-09: 4 mg via INTRAVENOUS

## 2019-04-09 MED ORDER — ACETAMINOPHEN 500 MG PO TABS
ORAL_TABLET | ORAL | Status: AC
  Filled 2019-04-09: qty 2

## 2019-04-09 MED ORDER — PHENYLEPHRINE 40 MCG/ML (10ML) SYRINGE FOR IV PUSH (FOR BLOOD PRESSURE SUPPORT)
PREFILLED_SYRINGE | INTRAVENOUS | Status: AC
  Filled 2019-04-09: qty 10

## 2019-04-09 MED ORDER — LACTATED RINGERS IV SOLN: INTRAVENOUS | Status: DC

## 2019-04-09 MED ORDER — MIDAZOLAM HCL 2 MG/2ML IJ SOLN
INTRAMUSCULAR | Status: AC
  Filled 2019-04-09: qty 2

## 2019-04-09 MED ORDER — PROPOFOL 10 MG/ML IV BOLUS
INTRAVENOUS | Status: AC
  Filled 2019-04-09: qty 20

## 2019-04-09 MED ORDER — FENTANYL CITRATE (PF) 100 MCG/2ML IJ SOLN: 25.0000 ug | INTRAMUSCULAR | Status: DC | PRN

## 2019-04-09 MED ORDER — CIPROFLOXACIN IN D5W 400 MG/200ML IV SOLN
400.0000 mg | INTRAVENOUS | Status: AC
  Administered 2019-04-09: 400 mg via INTRAVENOUS

## 2019-04-09 MED ORDER — ACETAMINOPHEN 500 MG PO TABS
1000.0000 mg | ORAL_TABLET | ORAL | Status: AC
  Administered 2019-04-09: 1000 mg via ORAL

## 2019-04-09 MED ORDER — LACTATED RINGERS IV SOLN
INTRAVENOUS | Status: DC
  Administered 2019-04-09: 08:00:00 via INTRAVENOUS

## 2019-04-09 MED ORDER — LIDOCAINE 2% (20 MG/ML) 5 ML SYRINGE
INTRAMUSCULAR | Status: AC
  Filled 2019-04-09: qty 5

## 2019-04-09 MED ORDER — FENTANYL CITRATE (PF) 100 MCG/2ML IJ SOLN
INTRAMUSCULAR | Status: AC
  Filled 2019-04-09: qty 2

## 2019-04-09 MED ORDER — SCOPOLAMINE 1 MG/3DAYS TD PT72
1.0000 | MEDICATED_PATCH | TRANSDERMAL | Status: DC
  Administered 2019-04-09: 1.5 mg via TRANSDERMAL

## 2019-04-09 MED ORDER — EPHEDRINE SULFATE-NACL 50-0.9 MG/10ML-% IV SOSY
PREFILLED_SYRINGE | INTRAVENOUS | Status: DC | PRN
  Administered 2019-04-09 (×2): 10 mg via INTRAVENOUS

## 2019-04-09 MED ORDER — CHLORHEXIDINE GLUCONATE CLOTH 2 % EX PADS: 6.0000 | MEDICATED_PAD | Freq: Once | CUTANEOUS | Status: DC

## 2019-04-09 MED ORDER — TRAMADOL HCL 50 MG PO TABS: 50.0000 mg | ORAL_TABLET | Freq: Four times a day (QID) | ORAL | 0 refills | Status: DC | PRN

## 2019-04-09 MED ORDER — LIDOCAINE 2% (20 MG/ML) 5 ML SYRINGE
INTRAMUSCULAR | Status: DC | PRN
  Administered 2019-04-09: 100 mg via INTRAVENOUS

## 2019-04-09 MED ORDER — GABAPENTIN 300 MG PO CAPS
ORAL_CAPSULE | ORAL | Status: AC
  Filled 2019-04-09: qty 1

## 2019-04-09 MED ORDER — DEXAMETHASONE SODIUM PHOSPHATE 10 MG/ML IJ SOLN
INTRAMUSCULAR | Status: AC
  Filled 2019-04-09: qty 1

## 2019-04-09 MED ORDER — EPHEDRINE 5 MG/ML INJ
INTRAVENOUS | Status: AC
  Filled 2019-04-09: qty 10

## 2019-04-09 MED ORDER — METOCLOPRAMIDE HCL 5 MG/ML IJ SOLN: 10.0000 mg | Freq: Once | INTRAMUSCULAR | Status: DC | PRN

## 2019-04-09 MED ORDER — PHENYLEPHRINE 40 MCG/ML (10ML) SYRINGE FOR IV PUSH (FOR BLOOD PRESSURE SUPPORT)
PREFILLED_SYRINGE | INTRAVENOUS | Status: DC | PRN
  Administered 2019-04-09 (×2): 120 ug via INTRAVENOUS
  Administered 2019-04-09 (×2): 80 ug via INTRAVENOUS

## 2019-04-09 MED ORDER — GABAPENTIN 300 MG PO CAPS
300.0000 mg | ORAL_CAPSULE | ORAL | Status: AC
  Administered 2019-04-09: 300 mg via ORAL

## 2019-04-09 MED ORDER — CELECOXIB 200 MG PO CAPS
ORAL_CAPSULE | ORAL | Status: AC
  Filled 2019-04-09: qty 1

## 2019-04-09 MED ORDER — PROPOFOL 10 MG/ML IV BOLUS
INTRAVENOUS | Status: DC | PRN
  Administered 2019-04-09: 50 mg via INTRAVENOUS
  Administered 2019-04-09: 200 mg via INTRAVENOUS

## 2019-04-09 MED ORDER — MEPERIDINE HCL 25 MG/ML IJ SOLN: 6.2500 mg | INTRAMUSCULAR | Status: DC | PRN

## 2019-04-09 MED ORDER — CELECOXIB 200 MG PO CAPS
200.0000 mg | ORAL_CAPSULE | ORAL | Status: AC
  Administered 2019-04-09: 200 mg via ORAL

## 2019-04-09 MED ORDER — MIDAZOLAM HCL 5 MG/5ML IJ SOLN
INTRAMUSCULAR | Status: DC | PRN
  Administered 2019-04-09: 2 mg via INTRAVENOUS

## 2019-04-09 MED ORDER — FENTANYL CITRATE (PF) 250 MCG/5ML IJ SOLN
INTRAMUSCULAR | Status: DC | PRN
  Administered 2019-04-09: 50 ug via INTRAVENOUS
  Administered 2019-04-09 (×2): 25 ug via INTRAVENOUS

## 2019-04-09 MED ORDER — SCOPOLAMINE 1 MG/3DAYS TD PT72
MEDICATED_PATCH | TRANSDERMAL | Status: AC
  Filled 2019-04-09: qty 1

## 2019-04-09 SURGICAL SUPPLY — 46 items
APPLIER CLIP 9.375 MED OPEN (MISCELLANEOUS)
BINDER BREAST XLRG (GAUZE/BANDAGES/DRESSINGS) ×3 IMPLANT
BLADE HEX COATED 2.75 (ELECTRODE) ×3 IMPLANT
BLADE SURG 15 STRL LF DISP TIS (BLADE) ×1 IMPLANT
BLADE SURG 15 STRL SS (BLADE) ×2
CANISTER SUC SOCK COL 7IN (MISCELLANEOUS) IMPLANT
CANISTER SUCT 1200ML W/VALVE (MISCELLANEOUS) IMPLANT
CHLORAPREP W/TINT 26 (MISCELLANEOUS) ×3 IMPLANT
CLIP APPLIE 9.375 MED OPEN (MISCELLANEOUS) IMPLANT
CLIP VESOCCLUDE SM WIDE 6/CT (CLIP) IMPLANT
COVER BACK TABLE REUSABLE LG (DRAPES) ×3 IMPLANT
COVER MAYO STAND REUSABLE (DRAPES) ×3 IMPLANT
COVER PROBE W GEL 5X96 (DRAPES) ×3 IMPLANT
COVER WAND RF STERILE (DRAPES) IMPLANT
DECANTER SPIKE VIAL GLASS SM (MISCELLANEOUS) ×3 IMPLANT
DERMABOND ADVANCED (GAUZE/BANDAGES/DRESSINGS) ×2
DERMABOND ADVANCED .7 DNX12 (GAUZE/BANDAGES/DRESSINGS) ×1 IMPLANT
DRAPE LAPAROSCOPIC ABDOMINAL (DRAPES) ×3 IMPLANT
DRAPE UTILITY XL STRL (DRAPES) ×3 IMPLANT
ELECT REM PT RETURN 9FT ADLT (ELECTROSURGICAL) ×3
ELECTRODE REM PT RTRN 9FT ADLT (ELECTROSURGICAL) ×1 IMPLANT
GAUZE SPONGE 4X4 12PLY STRL LF (GAUZE/BANDAGES/DRESSINGS) IMPLANT
GLOVE BIOGEL PI IND STRL 7.5 (GLOVE) ×1 IMPLANT
GLOVE BIOGEL PI INDICATOR 7.5 (GLOVE) ×2
GLOVE SURG SIGNA 7.5 PF LTX (GLOVE) ×3 IMPLANT
GOWN STRL REUS W/ TWL LRG LVL3 (GOWN DISPOSABLE) ×1 IMPLANT
GOWN STRL REUS W/ TWL XL LVL3 (GOWN DISPOSABLE) ×1 IMPLANT
GOWN STRL REUS W/TWL LRG LVL3 (GOWN DISPOSABLE) ×2
GOWN STRL REUS W/TWL XL LVL3 (GOWN DISPOSABLE) ×2
KIT MARKER MARGIN INK (KITS) ×3 IMPLANT
NEEDLE HYPO 25X1 1.5 SAFETY (NEEDLE) ×3 IMPLANT
NS IRRIG 1000ML POUR BTL (IV SOLUTION) IMPLANT
PACK BASIN DAY SURGERY FS (CUSTOM PROCEDURE TRAY) ×3 IMPLANT
PENCIL BUTTON HOLSTER BLD 10FT (ELECTRODE) ×3 IMPLANT
SLEEVE SCD COMPRESS KNEE MED (MISCELLANEOUS) ×3 IMPLANT
SPONGE LAP 4X18 RFD (DISPOSABLE) ×3 IMPLANT
SUT MNCRL AB 4-0 PS2 18 (SUTURE) ×3 IMPLANT
SUT SILK 2 0 SH (SUTURE) IMPLANT
SUT VIC AB 3-0 SH 27 (SUTURE) ×2
SUT VIC AB 3-0 SH 27X BRD (SUTURE) ×1 IMPLANT
SYR CONTROL 10ML LL (SYRINGE) ×3 IMPLANT
TOWEL GREEN STERILE FF (TOWEL DISPOSABLE) ×3 IMPLANT
TRAY FAXITRON CT DISP (TRAY / TRAY PROCEDURE) ×3 IMPLANT
TUBE CONNECTING 20'X1/4 (TUBING) ×1
TUBE CONNECTING 20X1/4 (TUBING) ×2 IMPLANT
YANKAUER SUCT BULB TIP NO VENT (SUCTIONS) ×3 IMPLANT

## 2019-04-09 NOTE — Anesthesia Procedure Notes (Signed)
Procedure Name: LMA Insertion Date/Time: 04/09/2019 8:40 AM Performed by: Myna Bright, CRNA Pre-anesthesia Checklist: Patient identified, Emergency Drugs available, Suction available and Patient being monitored Patient Re-evaluated:Patient Re-evaluated prior to induction Oxygen Delivery Method: Circle system utilized Preoxygenation: Pre-oxygenation with 100% oxygen Induction Type: IV induction Ventilation: Mask ventilation without difficulty LMA: LMA inserted LMA Size: 4.0 Tube type: Oral Number of attempts: 1 Placement Confirmation: positive ETCO2 and breath sounds checked- equal and bilateral Tube secured with: Tape Dental Injury: Teeth and Oropharynx as per pre-operative assessment

## 2019-04-09 NOTE — Anesthesia Preprocedure Evaluation (Signed)
Anesthesia Evaluation  Patient identified by MRN, date of birth, ID band Patient awake    Reviewed: Allergy & Precautions, NPO status , Patient's Chart, lab work & pertinent test results  History of Anesthesia Complications (+) PONV  Airway Mallampati: II  TM Distance: >3 FB Neck ROM: Full    Dental no notable dental hx.    Pulmonary neg pulmonary ROS,    Pulmonary exam normal breath sounds clear to auscultation       Cardiovascular negative cardio ROS Normal cardiovascular exam Rhythm:Regular Rate:Normal     Neuro/Psych negative neurological ROS  negative psych ROS   GI/Hepatic negative GI ROS, Neg liver ROS,   Endo/Other  negative endocrine ROS  Renal/GU negative Renal ROS  negative genitourinary   Musculoskeletal negative musculoskeletal ROS (+)   Abdominal   Peds negative pediatric ROS (+)  Hematology negative hematology ROS (+)   Anesthesia Other Findings   Reproductive/Obstetrics negative OB ROS                             Anesthesia Physical Anesthesia Plan  ASA: II  Anesthesia Plan: General   Post-op Pain Management:    Induction: Intravenous  PONV Risk Score and Plan: 4 or greater and Ondansetron, Dexamethasone, Midazolam, Scopolamine patch - Pre-op and Treatment may vary due to age or medical condition  Airway Management Planned: LMA  Additional Equipment:   Intra-op Plan:   Post-operative Plan: Extubation in OR  Informed Consent: I have reviewed the patients History and Physical, chart, labs and discussed the procedure including the risks, benefits and alternatives for the proposed anesthesia with the patient or authorized representative who has indicated his/her understanding and acceptance.     Dental advisory given  Plan Discussed with: CRNA  Anesthesia Plan Comments:         Anesthesia Quick Evaluation

## 2019-04-09 NOTE — Discharge Instructions (Signed)
°[facility] °Office Phone Number [phone] ° °BREAST BIOPSY/ PARTIAL MASTECTOMY: POST OP INSTRUCTIONS ° °Always review your discharge instruction sheet given to you by the facility where your surgery was performed. ° °IF YOU HAVE DISABILITY OR FAMILY LEAVE FORMS, YOU MUST BRING THEM TO THE OFFICE FOR PROCESSING.  DO NOT GIVE THEM TO YOUR DOCTOR. ° °1. A prescription for pain medication may be given to you upon discharge.  Take your pain medication as prescribed, if needed.  If narcotic pain medicine is not needed, then you may take acetaminophen (Tylenol) or ibuprofen (Advil) as needed. °2. Take your usually prescribed medications unless otherwise directed °3. If you need a refill on your pain medication, please contact your pharmacy.  They will contact our office to request authorization.  Prescriptions will not be filled after 5pm or on week-ends. °4. You should eat very light the first 24 hours after surgery, such as soup, crackers, pudding, etc.  Resume your normal diet the day after surgery. °5. Most patients will experience some swelling and bruising in the breast.  Ice packs and a good support bra will help.  Swelling and bruising can take several days to resolve.  °6. It is common to experience some constipation if taking pain medication after surgery.  Increasing fluid intake and taking a stool softener will usually help or prevent this problem from occurring.  A mild laxative (Milk of Magnesia or Miralax) should be taken according to package directions if there are no bowel movements after 48 hours. °7. Unless discharge instructions indicate otherwise, you may remove your bandages 24-48 hours after surgery, and you may shower at that time.  You may have steri-strips (small skin tapes) in place directly over the incision.  These strips should be left on the skin for 7-10 days.  If your surgeon used skin glue on the incision, you may shower in 24 hours.  The glue will flake off over the  next 2-3 weeks.  Any sutures or staples will be removed at the office during your follow-up visit. °8. ACTIVITIES:  You may resume regular daily activities (gradually increasing) beginning the next day.  Wearing a good support bra or sports bra minimizes pain and swelling.  You may have sexual intercourse when it is comfortable. °a. You may drive when you no longer are taking prescription pain medication, you can comfortably wear a seatbelt, and you can safely maneuver your car and apply brakes. °b. RETURN TO WORK:  ______________________________________________________________________________________ °9. You should see your doctor in the office for a follow-up appointment approximately two weeks after your surgery.  Your doctor’s nurse will typically make your follow-up appointment when she calls you with your pathology report.  Expect your pathology report 2-3 business days after your surgery.  You may call to check if you do not hear from us after three days. °10. OTHER INSTRUCTIONS:OK TO SHOWER STARTING TOMORROW °11. ICE PACK, TYLENOL, IBUPROFEN ALSO FOR PAIN °12. NO VIGOROUS ACTIVITY FOR ONE WEEK _______________________________________________________________________________________________ _____________________________________________________________________________________________________________________________________ °_____________________________________________________________________________________________________________________________________ °_____________________________________________________________________________________________________________________________________ ° °WHEN TO CALL YOUR DOCTOR: °1. Fever over 101.0 °2. Nausea and/or vomiting. °3. Extreme swelling or bruising. °4. Continued bleeding from incision. °5. Increased pain, redness, or drainage from the incision. ° °The clinic staff is available to answer your questions during regular business hours.  Please don’t hesitate to call  and ask to speak to one of the nurses for clinical concerns.  If you have a medical emergency, go to the nearest emergency room or call 911.  A surgeon   from [facility] is always on call at the hospital. ° °For further questions, please visit [email or web address]  ° ° ° ° ° °Post Anesthesia Home Care Instructions ° °Activity: °Get plenty of rest for the remainder of the day. A responsible individual must stay with you for 24 hours following the procedure.  °For the next 24 hours, DO NOT: °-Drive a car °-Operate machinery °-Drink alcoholic beverages °-Take any medication unless instructed by your physician °-Make any legal decisions or sign important papers. ° °Meals: °Start with liquid foods such as gelatin or soup. Progress to regular foods as tolerated. Avoid greasy, spicy, heavy foods. If nausea and/or vomiting occur, drink only clear liquids until the nausea and/or vomiting subsides. Call your physician if vomiting continues. ° °Special Instructions/Symptoms: °Your throat may feel dry or sore from the anesthesia or the breathing tube placed in your throat during surgery. If this causes discomfort, gargle with warm salt water. The discomfort should disappear within 24 hours. ° °If you had a scopolamine patch placed behind your ear for the management of post- operative nausea and/or vomiting: ° °1. The medication in the patch is effective for 72 hours, after which it should be removed.  Wrap patch in a tissue and discard in the trash. Wash hands thoroughly with soap and water. °2. You may remove the patch earlier than 72 hours if you experience unpleasant side effects which may include dry mouth, dizziness or visual disturbances. °3. Avoid touching the patch. Wash your hands with soap and water after contact with the patch. °   ° °

## 2019-04-09 NOTE — Interval H&P Note (Signed)
History and Physical Interval Note:no change in H and P  04/09/2019 8:07 AM  Kimberly Brewer  has presented today for surgery, with the diagnosis of LEFT BREAST MASS.  The various methods of treatment have been discussed with the patient and family. After consideration of risks, benefits and other options for treatment, the patient has consented to  Procedure(s): LEFT BREAST LUMPECTOMY WITH RADIOACTIVE SEED LOCALIZATION (Left) as a surgical intervention.  The patient's history has been reviewed, patient examined, no change in status, stable for surgery.  I have reviewed the patient's chart and labs.  Questions were answered to the patient's satisfaction.     Coralie Keens

## 2019-04-09 NOTE — Op Note (Signed)
LEFT BREAST LUMPECTOMY WITH RADIOACTIVE SEED LOCALIZATION  Procedure Note  Tony Friscia 04/09/2019   Pre-op Diagnosis: LEFT BREAST MASS     Post-op Diagnosis: same  Procedure(s): LEFT BREAST LUMPECTOMY WITH RADIOACTIVE SEED LOCALIZATION  Surgeon(s): Coralie Keens, MD  Anesthesia: General  Staff:  Circulator: Patric Dykes, RN Scrub Person: Paulette Blanch, RN  Estimated Blood Loss: Minimal               Specimens: sent to path  Indications: This is a 53 year old female has had bilateral breast reductions.  On her recent screening mammography, she had an area of abnormality.  She underwent a stereotactic biopsy which showed spindle cells.  This was felt to be discordant so a lumpectomy has been recommended  Procedure: The patient was brought to the operating room and identifies correct patient.  She was placed upon the operating table and general anesthesia was induced.  Her left breast was then prepped and draped in usual sterile fashion.  Identified the radioactive seed at the lower outer quadrant of the left breast near the areola.  I anesthetized skin with Marcaine and made a circumareolar incision with a scalpel.  I then dissected down to the deep breast tissue where the radioactive radioactive seed was located with the aid of the neoprobe.  I was then able to excise around the radioactive seed with aid of neoprobe removing the lumpectomy specimen.  Once the specimen was removed, I marked all margins with marker paint, x-rayed the specimen, and confirm the radioactive seed and previous marker were in the specimen.  This was then sent to pathology for evaluation.  I achieved hemostasis with the cautery and placed 2 surgical clips at the biopsy cavity.  I anesthetized the incision further with Marcaine.  I then closed the deep and subcutaneous tissue with interrupted 3-0 Vicryl sutures and closed the skin with a running 4-0 Monocryl.  Dermabond was then applied.  The patient  tolerated procedure well.  All the counts were correct at the end of the procedure.  The patient was then extubated in the operating room and taken in a stable condition to the recovery room.          Coralie Keens   Date: 04/09/2019  Time: 9:21 AM

## 2019-04-09 NOTE — Transfer of Care (Signed)
Immediate Anesthesia Transfer of Care Note  Patient: Kimberly Brewer  Procedure(s) Performed: LEFT BREAST LUMPECTOMY WITH RADIOACTIVE SEED LOCALIZATION (Left Breast)  Patient Location: PACU  Anesthesia Type:General  Level of Consciousness: awake, alert , oriented and patient cooperative  Airway & Oxygen Therapy: Patient Spontanous Breathing and Patient connected to face mask oxygen  Post-op Assessment: Report given to RN, Post -op Vital signs reviewed and stable and Patient moving all extremities  Post vital signs: Reviewed and stable  Last Vitals:  Vitals Value Taken Time  BP 109/60 04/09/19 0932  Temp    Pulse 81 04/09/19 0933  Resp 18 04/09/19 0933  SpO2 100 % 04/09/19 0933  Vitals shown include unvalidated device data.  Last Pain:  Vitals:   04/09/19 0725  TempSrc: Oral  PainSc: 0-No pain         Complications: No apparent anesthesia complications

## 2019-04-09 NOTE — Anesthesia Postprocedure Evaluation (Signed)
Anesthesia Post Note  Patient: Kimberly Brewer  Procedure(s) Performed: LEFT BREAST LUMPECTOMY WITH RADIOACTIVE SEED LOCALIZATION (Left Breast)     Patient location during evaluation: PACU Anesthesia Type: General Level of consciousness: awake and alert Pain management: pain level controlled Vital Signs Assessment: post-procedure vital signs reviewed and stable Respiratory status: spontaneous breathing, nonlabored ventilation, respiratory function stable and patient connected to nasal cannula oxygen Cardiovascular status: blood pressure returned to baseline and stable Postop Assessment: no apparent nausea or vomiting Anesthetic complications: no    Last Vitals:  Vitals:   04/09/19 1015 04/09/19 1046  BP: 114/81 130/70  Pulse: 70   Resp: 13 20  Temp:  36.6 C  SpO2: 97% 97%    Last Pain:  Vitals:   04/09/19 1046  TempSrc: Oral  PainSc: 0-No pain                 Montez Hageman

## 2019-04-10 ENCOUNTER — Encounter (HOSPITAL_BASED_OUTPATIENT_CLINIC_OR_DEPARTMENT_OTHER): Payer: Self-pay | Admitting: Surgery

## 2019-06-10 ENCOUNTER — Other Ambulatory Visit: Payer: Self-pay

## 2019-06-10 ENCOUNTER — Other Ambulatory Visit: Payer: Self-pay | Admitting: Sports Medicine

## 2019-06-10 DIAGNOSIS — M79672 Pain in left foot: Secondary | ICD-10-CM

## 2019-06-11 ENCOUNTER — Encounter: Payer: Self-pay | Admitting: Sports Medicine

## 2019-06-11 ENCOUNTER — Other Ambulatory Visit: Payer: Self-pay

## 2019-06-11 ENCOUNTER — Ambulatory Visit (INDEPENDENT_AMBULATORY_CARE_PROVIDER_SITE_OTHER): Payer: Managed Care, Other (non HMO)

## 2019-06-11 ENCOUNTER — Ambulatory Visit (INDEPENDENT_AMBULATORY_CARE_PROVIDER_SITE_OTHER): Payer: Managed Care, Other (non HMO) | Admitting: Sports Medicine

## 2019-06-11 DIAGNOSIS — M7732 Calcaneal spur, left foot: Secondary | ICD-10-CM

## 2019-06-11 DIAGNOSIS — M729 Fibroblastic disorder, unspecified: Secondary | ICD-10-CM | POA: Diagnosis not present

## 2019-06-11 DIAGNOSIS — M79672 Pain in left foot: Secondary | ICD-10-CM | POA: Diagnosis not present

## 2019-06-11 DIAGNOSIS — M7662 Achilles tendinitis, left leg: Secondary | ICD-10-CM | POA: Diagnosis not present

## 2019-06-11 MED ORDER — PREDNISONE 10 MG (21) PO TBPK: ORAL_TABLET | ORAL | 0 refills | Status: DC

## 2019-06-11 NOTE — Patient Instructions (Signed)
Achilles Tendinitis Rehab Ask your health care provider which exercises are safe for you. Do exercises exactly as told by your health care provider and adjust them as directed. It is normal to feel mild stretching, pulling, tightness, or discomfort as you do these exercises. Stop right away if you feel sudden pain or your pain gets worse. Do not begin these exercises until told by your health care provider. Stretching and range-of-motion exercises These exercises warm up your muscles and joints and improve the movement and flexibility of your ankle. These exercises also help to relieve pain. Standing wall calf stretch with straight knee  1. Stand with your hands against a wall. 2. Extend your left / right leg behind you, and bend your front knee slightly. Keep both of your heels on the floor. 3. Point the toes of your back foot slightly inward. 4. Keeping your heels on the floor and your back knee straight, shift your weight toward the wall. Do not allow your back to arch. You should feel a gentle stretch in your upper calf. 5. Hold this position for __________ seconds. Repeat __________ times. Complete this exercise __________ times a day. Standing wall calf stretch with bent knee 1. Stand with your hands against a wall. 2. Extend your left / right leg behind you, and bend your front knee slightly. Keep both of your heels on the floor. 3. Point the toes of your back foot slightly inward. 4. Keeping your heels on the floor, bend your back knee slightly. You should feel a gentle stretch deep in your lower calf near your heel. 5. Hold this position for __________ seconds. Repeat __________ times. Complete this exercise __________ times a day. Strengthening exercises These exercises build strength and control of your ankle. Endurance is the ability to use your muscles for a long time, even after they get tired. Plantar flexion with band In this exercise, you push your toes downward, away from you,  with an exercise band providing resistance. 1. Sit on the floor with your left / right leg extended. You may put a pillow under your calf to give your foot more room to move. 2. Loop a rubber exercise band or tube around the ball of your left / right foot. The ball of your foot is on the walking surface, right under your toes. The band or tube should be slightly tense when your foot is relaxed. If the band or tube slips, you can put on your shoe or put a washcloth between the band and your foot to help it stay in place. 3. Slowly point your toes downward, pushing them away from you (plantar flexion). 4. Hold this position for __________ seconds. 5. Slowly release the tension in the band or tube, controlling smoothly until your foot is back to the starting position. 6. Repeat steps 1-5 with your left / right leg. Repeat __________ times. Complete this exercise __________ times a day. Eccentric heel drop  In this exercise, you stand and slowly raise your heel and then slowly lower it. This exercise lengthens the calf muscles (eccentric) while the heel bears weight. If this exercise is too easy, try doing it while wearing a backpack with weights in it. 1. Stand on a step with the balls of your feet. The ball of your foot is on the walking surface, right under your toes. ? Do not put your heels on the step. ? For balance, rest your hands on the wall or on a railing. 2. Rise up onto   the balls of your feet. 3. Keeping your heels up, shift all of your weight to your left / right leg and pick up your other leg. 4. Slowly lower your left / right leg so your heel drops below the level of the step. 5. Put down your other foot before returning to the start position. If told by your health care provider, build up to: ? 3 sets of 15 repetitions while keeping your knees straight. ? 3 sets of 15 repetitions while keeping your knees slightly bent as far as told by your health care provider. Repeat __________  times. Complete this exercise __________ times a day. Balance exercises These exercises improve or maintain your balance. Balance is important in preventing falls. Single leg stand If this exercise is too easy, you can try it with your eyes closed or while standing on a pillow. 1. Without shoes, stand near a railing or in a door frame. Hold on to the railing or door frame as needed. 2. Stand on your left / right foot. Keep your big toe down on the floor and try to keep your arch lifted. 3. Hold this position for __________ seconds. Repeat __________ times. Complete this exercise __________ times a day. This information is not intended to replace advice given to you by your health care provider. Make sure you discuss any questions you have with your health care provider. Document Released: 04/04/2005 Document Revised: 12/22/2018 Document Reviewed: 06/16/2018 Elsevier Patient Education  2020 [employer]  

## 2019-06-11 NOTE — Progress Notes (Signed)
Subjective: Kimberly Brewer is a 53 y.o. female patient who presents to office for evaluation of left heel pain. Patient complains of progressive pain especially over the last month in the left foot at the Achilles. Ranks pain 5-6/10 and is now interferring with daily activities and reports that there is a bump on the back of the heel sometimes at the end of the day there is sharp burning to the area with some redness worse when she wears flip-flops and has treated with rest and elevation and Advil reports that pain is worse after period of sitting and she goes to stand or with first few steps out of bed in the morning.  Patient is a Runner, broadcasting/film/video at [employer]  Patient denies any other pedal complaints.   Review of Systems  All other systems reviewed and are negative.    Patient Active Problem List   Diagnosis Date Noted  . Abnormal mammogram of right breast 08/12/2017  . Frequent headaches 06/22/2016  . Pelvic pain 06/22/2016  . Weight gain 06/22/2016  . Family history of osteoporosis 06/12/2016  . BMI 35.0-35.9,adult 12/05/2015  . History of female sterilization 12/05/2015  . Women's annual routine gynecological examination 12/05/2015    Current Outpatient Medications on File Prior to Visit  Medication Sig Dispense Refill  . ammonium lactate (LAC-HYDRIN) 12 % lotion APPLY TO ARMS AND LEGS TWICE DAILY    . cetirizine (ZYRTEC) 10 MG chewable tablet Chew 10 mg by mouth daily.    Marland Kitchen doxycycline (ADOXA) 75 MG tablet Take 20 mg by mouth 2 (two) times daily.    . Ivermectin (SOOLANTRA) 1 % CREA Apply topically.    Marland Kitchen ketoconazole (NIZORAL) 2 % shampoo APPLY TO DAMP SCALP AS DIRECTED    . latanoprost (XALATAN) 0.005 % ophthalmic solution INSTILL 1 DROP INTO AFFECTED EYE EVERY DAY IN THE EVENING    . Travoprost, BAK Free, (TRAVATAN) 0.004 % SOLN ophthalmic solution 1 drop at bedtime.     No current facility-administered medications on file prior to visit.     Allergies  Allergen Reactions  . Penicillins  Swelling  . Strawberry Flavor Swelling    Objective:  General: Alert and oriented x3 in no acute distress  Dermatology: No open lesions bilateral lower extremities, no webspace macerations, no ecchymosis bilateral, all nails x 10 are well manicured.  Vascular: Dorsalis Pedis and Posterior Tibial pedal pulses 1/4, Capillary Fill Time 3 seconds, + pedal hair growth bilateral, no edema bilateral lower extremities, Temperature gradient within normal limits.  Neurology: Michaell Cowing sensation intact via light touch bilateral.  Musculoskeletal: Mild tenderness with palpation at insertion of the plantar fascia and Achilles on left with the worst pain at the Achilles, there is calcaneal exostosis with mild soft tissue present and decreased ankle rom with knee extending  vs flexed resembling gastroc equnius bilateral, The achilles tendon feels intact with no nodularity or palpable dell, Thompson sign negative, Subtalar and midtarsal joint range of motion is within normal limits, there is no 1st ray hypermobility or forefoot deformity noted bilateral.   Gait: Antalgic gait with increased heel off left.  Xrays  Left foot   Impression: Normal osseous mineralization. Joint spaces preserved. No fracture/dislocation/boney destruction. Calcaneal spur present. Kager's triangle intact with no obliteration. No soft tissue abnormalities or radiopaque foreign bodies.   Assessment and Plan: Problem List Items Addressed This Visit    None    Visit Diagnoses    Achilles tendinitis of left lower extremity    -  Primary  Fasciitis       Heel spur, left       Pain of left heel         -Complete examination performed -Xrays reviewed -Discussed treatement options for Achilles tendinitis with some residual fasciitis -Rx prednisone Dosepak, heel lifts, icing, gentle stretching and dispensed night splint and instructed patient on proper use -Advised patient to wear good supportive shoes and avoid flat shoes or  flip-flops -No improvement will consider MRI/PT/EPAT -Patient to return to office 3 to 4 weeks or sooner if condition worsens.  Landis Martins, DPM

## 2019-06-17 ENCOUNTER — Other Ambulatory Visit: Payer: Self-pay | Admitting: Sports Medicine

## 2019-06-17 DIAGNOSIS — M7662 Achilles tendinitis, left leg: Secondary | ICD-10-CM

## 2019-06-17 DIAGNOSIS — M79672 Pain in left foot: Secondary | ICD-10-CM

## 2019-07-02 ENCOUNTER — Ambulatory Visit (INDEPENDENT_AMBULATORY_CARE_PROVIDER_SITE_OTHER): Payer: Managed Care, Other (non HMO) | Admitting: Sports Medicine

## 2019-07-02 ENCOUNTER — Ambulatory Visit: Payer: Managed Care, Other (non HMO) | Admitting: Sports Medicine

## 2019-07-02 ENCOUNTER — Other Ambulatory Visit: Payer: Self-pay

## 2019-07-02 ENCOUNTER — Encounter: Payer: Self-pay | Admitting: Sports Medicine

## 2019-07-02 DIAGNOSIS — M7662 Achilles tendinitis, left leg: Secondary | ICD-10-CM | POA: Diagnosis not present

## 2019-07-02 DIAGNOSIS — M7732 Calcaneal spur, left foot: Secondary | ICD-10-CM

## 2019-07-02 DIAGNOSIS — M79672 Pain in left foot: Secondary | ICD-10-CM

## 2019-07-02 MED ORDER — TRIAMCINOLONE ACETONIDE 10 MG/ML IJ SUSP: 10.0000 mg | Freq: Once | INTRAMUSCULAR | Status: DC

## 2019-07-02 NOTE — Progress Notes (Signed)
Subjective: Kimberly Brewer is a 53 y.o. female patient who returns to the office for follow-up evaluation of left heel pain.  Patient reports that it was doing good while she was on the steroids but then after about 3 to 4 days after finishing the steroids the pain slowly came back pain is 4-5 out of 10 off and on worse when she goes from sitting to standing up or when she is going up or down the steps especially at the back of the heel denies any current pain at the bottom of the heel patient has been using her night splint icing using her heel cushion and taking Tylenol as needed without complete relief.  Patient denies any changes with medical history or any other pedal complaints at this time.  Patient Active Problem List   Diagnosis Date Noted  . Abnormal mammogram of right breast 08/12/2017  . Frequent headaches 06/22/2016  . Pelvic pain 06/22/2016  . Weight gain 06/22/2016  . Family history of osteoporosis 06/12/2016  . BMI 35.0-35.9,adult 12/05/2015  . History of female sterilization 12/05/2015  . Women's annual routine gynecological examination 12/05/2015    Current Outpatient Medications on File Prior to Visit  Medication Sig Dispense Refill  . ammonium lactate (LAC-HYDRIN) 12 % lotion APPLY TO ARMS AND LEGS TWICE DAILY    . cetirizine (ZYRTEC) 10 MG chewable tablet Chew 10 mg by mouth daily.    Marland Kitchen doxycycline (ADOXA) 75 MG tablet Take 20 mg by mouth 2 (two) times daily.    . Ivermectin (SOOLANTRA) 1 % CREA Apply topically.    Marland Kitchen ketoconazole (NIZORAL) 2 % shampoo APPLY TO DAMP SCALP AS DIRECTED    . latanoprost (XALATAN) 0.005 % ophthalmic solution INSTILL 1 DROP INTO AFFECTED EYE EVERY DAY IN THE EVENING    . predniSONE (STERAPRED UNI-PAK 21 TAB) 10 MG (21) TBPK tablet Take as directed 21 tablet 0  . Travoprost, BAK Free, (TRAVATAN) 0.004 % SOLN ophthalmic solution 1 drop at bedtime.     No current facility-administered medications on file prior to visit.     Allergies  Allergen  Reactions  . Penicillins Swelling  . Strawberry Flavor Swelling    Objective:  General: Alert and oriented x3 in no acute distress  Dermatology: No open lesions bilateral lower extremities, no webspace macerations, no ecchymosis bilateral, all nails x 10 are well manicured.  Vascular: Dorsalis Pedis and Posterior Tibial pedal pulses 1/4, Capillary Fill Time 3 seconds, + pedal hair growth bilateral, no edema bilateral lower extremities, Temperature gradient within normal limits.  Neurology: Michaell Cowing sensation intact via light touch bilateral.  Musculoskeletal: Mild tenderness with palpation at insertion of the Achilles on left especially at the medial insertion, there is calcaneal exostosis with mild soft tissue present and decreased ankle rom with knee extending  vs flexed resembling gastroc equnius bilateral, The achilles tendon feels intact with no nodularity or palpable dell, Thompson sign negative, Subtalar and midtarsal joint range of motion is within normal limits, there is no 1st ray hypermobility or forefoot deformity noted bilateral.    Assessment and Plan: Problem List Items Addressed This Visit    None    Visit Diagnoses    Achilles tendinitis of left lower extremity    -  Primary   Relevant Medications   triamcinolone acetonide (KENALOG) 10 MG/ML injection 10 mg (Start on 07/02/2019 12:30 PM)   Heel spur, left       Pain of left heel         -  Complete examination performed -Discussed treatment options for Achilles tendinitis insertional over non-insertional -After oral consent and aseptic prep, injected a mixture containing 1 ml of 2%  plain lidocaine, 1 ml 0.5% plain marcaine, 0.5 ml of kenalog 10 and 0.5 ml of dexamethasone phosphate into medial insertion of the Achilles on left without complication. Post-injection care discussed with patient.  -Dispensed cam boot for patient to use when she is attempting to ambulate to avoid any overstress or injury to the tendon after  injection advised patient if she is doing well after a week may slowly transition to using her tennis shoe with heel lift again -Advised patient to continue with night splint -If no improvement will consider MRI/PT/EPAT -Patient to return to office 3 to 4 weeks or sooner if condition worsens.  Landis Martins, DPM

## 2019-07-23 ENCOUNTER — Other Ambulatory Visit: Payer: Self-pay

## 2019-07-23 ENCOUNTER — Ambulatory Visit
Admission: RE | Admit: 2019-07-23 | Discharge: 2019-07-23 | Disposition: A | Discharge status: Home / Self Care | Payer: 59 | Source: Ambulatory Visit | Attending: Obstetrics & Gynecology | Admitting: Obstetrics & Gynecology

## 2019-07-23 ENCOUNTER — Other Ambulatory Visit: Payer: Self-pay | Admitting: Obstetrics & Gynecology

## 2019-07-23 DIAGNOSIS — R921 Mammographic calcification found on diagnostic imaging of breast: Secondary | ICD-10-CM

## 2019-07-30 ENCOUNTER — Ambulatory Visit: Payer: Managed Care, Other (non HMO) | Admitting: Sports Medicine

## 2019-08-05 ENCOUNTER — Encounter: Payer: Self-pay | Admitting: Sports Medicine

## 2019-08-05 ENCOUNTER — Ambulatory Visit (INDEPENDENT_AMBULATORY_CARE_PROVIDER_SITE_OTHER): Payer: 59 | Admitting: Sports Medicine

## 2019-08-05 ENCOUNTER — Other Ambulatory Visit: Payer: Self-pay

## 2019-08-05 DIAGNOSIS — M79672 Pain in left foot: Secondary | ICD-10-CM

## 2019-08-05 DIAGNOSIS — M792 Neuralgia and neuritis, unspecified: Secondary | ICD-10-CM | POA: Diagnosis not present

## 2019-08-05 DIAGNOSIS — M7732 Calcaneal spur, left foot: Secondary | ICD-10-CM

## 2019-08-05 DIAGNOSIS — M7662 Achilles tendinitis, left leg: Secondary | ICD-10-CM | POA: Diagnosis not present

## 2019-08-05 DIAGNOSIS — M79671 Pain in right foot: Secondary | ICD-10-CM

## 2019-08-05 NOTE — Progress Notes (Signed)
Subjective: Azura Tufaro is a 53 y.o. female patient who returns to office for follow up evaluation of left heel pain. Reports that her symptoms are better on achilles on left, reports a little bit of soreness 2 out of 10 left leg from prior.  Currently using night splint stretching icing and heel lifts.  Patient reports that there is a new pain over the last 2 weeks at the lateral side of the right foot burning off-and-on pain sometimes worse at night out of 10 denies any injury redness warmth swelling or trauma has also tried to stretch this out as well.  Patient has no other pedal complaints at this time.  Patient Active Problem List   Diagnosis Date Noted  . Abnormal mammogram of right breast 08/12/2017  . Frequent headaches 06/22/2016  . Pelvic pain 06/22/2016  . Weight gain 06/22/2016  . Family history of osteoporosis 06/12/2016  . BMI 35.0-35.9,adult 12/05/2015  . History of female sterilization 12/05/2015  . Women's annual routine gynecological examination 12/05/2015    Current Outpatient Medications on File Prior to Visit  Medication Sig Dispense Refill  . ammonium lactate (LAC-HYDRIN) 12 % lotion APPLY TO ARMS AND LEGS TWICE DAILY    . cetirizine (ZYRTEC) 10 MG chewable tablet Chew 10 mg by mouth daily.    Marland Kitchen doxycycline (ADOXA) 75 MG tablet Take 20 mg by mouth 2 (two) times daily.    Marland Kitchen doxycycline (PERIOSTAT) 20 MG tablet Take 20 mg by mouth 2 (two) times daily.    . Ivermectin (SOOLANTRA) 1 % CREA Apply topically.    Marland Kitchen ketoconazole (NIZORAL) 2 % shampoo APPLY TO DAMP SCALP AS DIRECTED    . latanoprost (XALATAN) 0.005 % ophthalmic solution INSTILL 1 DROP INTO AFFECTED EYE EVERY DAY IN THE EVENING    . predniSONE (STERAPRED UNI-PAK 21 TAB) 10 MG (21) TBPK tablet Take as directed 21 tablet 0  . Travoprost, BAK Free, (TRAVATAN) 0.004 % SOLN ophthalmic solution 1 drop at bedtime.     Current Facility-Administered Medications on File Prior to Visit  Medication Dose Route Frequency  Provider Last Rate Last Dose  . triamcinolone acetonide (KENALOG) 10 MG/ML injection 10 mg  10 mg Other Once Landis Martins, DPM        Allergies  Allergen Reactions  . Penicillins Swelling  . Strawberry Flavor Swelling    Objective:  General: Alert and oriented x3 in no acute distress  Dermatology: No open lesions bilateral lower extremities, no webspace macerations, no ecchymosis bilateral, all nails x 10 are well manicured.  Vascular: Dorsalis Pedis and Posterior Tibial pedal pulses palpable, Capillary Fill Time 3 seconds,(+) pedal hair growth bilateral, no edema bilateral lower extremities, Temperature gradient within normal limits.  Neurology: Gross sensation intact via light touch bilateral, Tinel's sign noted over the sural nerve course on the right foot.  Musculoskeletal: No tenderness at Achilles insertion on the left.  There is mild tenderness noted to the dorsal lateral foot on the right.  No frank instability of the right foot or ankle.   Patient declined a new set of x-rays today for the right.  Assessment and Plan: Problem List Items Addressed This Visit    None    Visit Diagnoses    Achilles tendinitis of left lower extremity    -  Primary   Heel spur, left       Pain of left heel       Neuritis       Right foot pain           -  Complete examination performed -Discussed treatment options for likely new onset neuritis secondary to compensation for previous treatment for Achilles tendinitis -Advised patient to try topical pain cream or rub to the right foot as well as icing and continue with gentle stretching if worsens may use cam boot to the right foot and return to office -Advised patient for the left foot to continue with night splint and heel lifts and if there is a flare to return to office -Advised good supportive shoes daily -Patient to return to office as needed or sooner if condition worsens.  Asencion Islam, DPM

## 2020-01-11 ENCOUNTER — Other Ambulatory Visit: Payer: Self-pay | Admitting: *Deleted

## 2020-01-29 ENCOUNTER — Ambulatory Visit
Admission: RE | Admit: 2020-01-29 | Discharge: 2020-01-29 | Disposition: A | Discharge status: Home / Self Care | Payer: 59 | Source: Ambulatory Visit | Attending: Obstetrics & Gynecology | Admitting: Obstetrics & Gynecology

## 2020-01-29 ENCOUNTER — Other Ambulatory Visit: Payer: Self-pay

## 2020-01-29 DIAGNOSIS — R921 Mammographic calcification found on diagnostic imaging of breast: Secondary | ICD-10-CM

## 2020-04-29 ENCOUNTER — Ambulatory Visit: Payer: 59 | Admitting: Sports Medicine

## 2020-05-31 ENCOUNTER — Ambulatory Visit (INDEPENDENT_AMBULATORY_CARE_PROVIDER_SITE_OTHER): Payer: 59 | Admitting: Allergy and Immunology

## 2020-05-31 ENCOUNTER — Encounter: Payer: Self-pay | Admitting: Allergy and Immunology

## 2020-05-31 ENCOUNTER — Other Ambulatory Visit: Payer: Self-pay

## 2020-05-31 VITALS — BP 116/78 | HR 70 | Resp 16 | Ht 68.0 in | Wt 210.6 lb

## 2020-05-31 DIAGNOSIS — J301 Allergic rhinitis due to pollen: Secondary | ICD-10-CM

## 2020-05-31 DIAGNOSIS — T781XXA Other adverse food reactions, not elsewhere classified, initial encounter: Secondary | ICD-10-CM | POA: Diagnosis not present

## 2020-05-31 MED ORDER — EPINEPHRINE 0.3 MG/0.3ML IJ SOAJ: 0.3000 mg | Freq: Once | INTRAMUSCULAR | 1 refills | Status: AC

## 2020-05-31 NOTE — Progress Notes (Signed)
[facility] [place] [facility] [place] [facility] [place] [facility] [place]   NEW PATIENT NOTE  Referring Provider: Galvin Proffer, MD Primary Provider: Galvin Proffer, MD Date of office visit: 05/31/2020    Subjective:   Chief Complaint:  Kimberly Brewer (DOB: 02/07/1966) is a 54 y.o. female who presents to the clinic on 05/31/2020 with a chief complaint of Food Intolerance (pt states when she eats peanuts, she develops itching that lasts for days) .  HPI: Kimberly Brewer presents to this clinic in evaluation of possible food allergy.  Apparently I have seen her in this clinic over a decade ago for similar issues associated with allergic reactions that may been related to consumption of strawberries and pineapple and watermelon.  She can now eat pineapples and watermelon without any problem but still has problems with strawberries.  Her new issue is that over the course of the past 2 months she has had reactions when eating peanuts and cashew.  Interestingly, her reaction after eating peanuts and cashews starts about 2 hours after consumption and can last up to 3 days.  She develops prolonged and persistent itching without any obvious dermatitis and no other associated systemic or constitutional symptoms.  She also appears to have an issue with seasonal allergic rhinitis for which she uses Zyrtec.  She does have some chronic rhinorrhea which once again seems to respond somewhat to Zyrtec.  Past Medical History:  Diagnosis Date  . Glaucoma   . Rosacea     Past Surgical History:  Procedure Laterality Date  . APPENDECTOMY    . BREAST LUMPECTOMY WITH RADIOACTIVE SEED LOCALIZATION Left 04/09/2019   Procedure: LEFT BREAST LUMPECTOMY WITH RADIOACTIVE SEED LOCALIZATION;  Surgeon: Abigail Miyamoto, MD;  Location: [facility];  Service: General;  Laterality: Left;  . BREAST REDUCTION SURGERY Bilateral 08/28/2017   Procedure: BILATERAL MAMMARY REDUCTION  (BREAST);  Surgeon: Contogiannis, Chales Abrahams,  MD;  Location: [facility];  Service: Plastics;  Laterality: Bilateral;  . LIPOSUCTION Bilateral 08/28/2017   Procedure: LIPOSUCTION;  Surgeon: Contogiannis, Chales Abrahams, MD;  Location:  SURGERY CENTER;  Service: Plastics;  Laterality: Bilateral;  . NASAL SINUS SURGERY    . REDUCTION MAMMAPLASTY Bilateral 2018  . TUBAL LIGATION      Allergies as of 05/31/2020      Reactions   Penicillins Swelling   Strawberry Flavor Swelling      Medication List    cetirizine 10 MG chewable tablet Commonly known as: ZYRTEC Chew 10 mg by mouth daily.   doxycycline 20 MG tablet Commonly known as: PERIOSTAT Take 20 mg by mouth 2 (two) times daily.   ketoconazole 2 % shampoo Commonly known as: NIZORAL APPLY TO DAMP SCALP AS DIRECTED   latanoprost 0.005 % ophthalmic solution Commonly known as: XALATAN INSTILL 1 DROP INTO AFFECTED EYE EVERY DAY IN THE EVENING   Soolantra 1 % Crea Generic drug: Ivermectin Apply topically.       Review of systems negative except as noted in HPI / PMHx or noted below:  Review of Systems  Constitutional: Negative.   HENT: Negative.   Eyes: Negative.   Respiratory: Negative.   Cardiovascular: Negative.   Gastrointestinal: Negative.   Genitourinary: Negative.   Musculoskeletal: Negative.   Skin: Negative.   Neurological: Negative.   Endo/Heme/Allergies: Negative.   Psychiatric/Behavioral: Negative.     History reviewed. No pertinent family history.  Social History   Socioeconomic History  . Marital status: Married    Spouse  name: Not on file  . Number of children: Not on file  . Years of education: Not on file  . Highest education level: Not on file  Occupational History  . Not on file  Tobacco Use  . Smoking status: Never Smoker  . Smokeless tobacco: Never Used  Vaping Use  . Vaping Use: Never used  Substance and Sexual Activity  . Alcohol use: No  . Drug use: No  . Sexual activity: Not on file  Other Topics  Concern  . Not on file  Social History Narrative  . Not on file   Environmental and Social history  Lives in a house with a dry environment, no animals located inside the household, carpet in the bedroom, plastic on the bed, plastic on the pillow, no smoking ongoing with inside the household, and employment in an office setting as a professor Pharmacologist.  Objective:   Vitals:   05/31/20 1413  BP: 116/78  Pulse: 70  Resp: 16  SpO2: 98%   Height: 5\' 8"  (172.7 cm) Weight: 210 lb 9.6 oz (95.5 kg)  Physical Exam Constitutional:      Appearance: She is not diaphoretic.  HENT:     Head: Normocephalic.     Right Ear: Tympanic membrane, ear canal and external ear normal.     Left Ear: Tympanic membrane, ear canal and external ear normal.     Nose: Nose normal. No mucosal edema or rhinorrhea.     Mouth/Throat:     Pharynx: Uvula midline. No oropharyngeal exudate.  Eyes:     Conjunctiva/sclera: Conjunctivae normal.  Neck:     Thyroid: No thyromegaly.     Trachea: Trachea normal. No tracheal tenderness or tracheal deviation.  Cardiovascular:     Rate and Rhythm: Normal rate and regular rhythm.     Heart sounds: Normal heart sounds, S1 normal and S2 normal. No murmur heard.   Pulmonary:     Effort: No respiratory distress.     Breath sounds: Normal breath sounds. No stridor. No wheezing or rales.  Lymphadenopathy:     Head:     Right side of head: No tonsillar adenopathy.     Left side of head: No tonsillar adenopathy.     Cervical: No cervical adenopathy.  Skin:    Findings: No erythema or rash.     Nails: There is no clubbing.  Neurological:     Mental Status: She is alert.     Diagnostics: Allergy skin tests were performed.  She did not demonstrate any hypersensitivity against a panel of tree nuts or peanuts.  Assessment and Plan:    1. Adverse food reaction, initial encounter   2. Seasonal allergic rhinitis due to pollen     1.  Allergen avoidance  measures?  2.  Auvi-Q 0.3, Benadryl, MD/ER evaluation for allergic reaction  3.  Further evaluation for progressive allergic disease? Blood - nut panel w/R  4.  Obtain fall flu vaccine and Covid booster  5.  Continue Zyrtec if needed  6.  Return to clinic in 1 year or earlier if problem  To further investigate Kimberly Brewer's possible food hypersensitivity we will check IgE antibodies directed against tree nuts and peanuts and based upon the results of this analysis make a determination about whether or not she will require further evaluation for her condition.  I will contact her with the results of these blood test once they are available for review.  , MD Allergy / Immunology [facility]

## 2020-05-31 NOTE — Patient Instructions (Addendum)
  1.  Allergen avoidance measures?  2.  Auvi-Q 0.3, Benadryl, MD/ER evaluation for allergic reaction  3.  Further evaluation for progressive allergic disease? Blood - nut panel w/R  4.  Obtain fall flu vaccine and Covid booster  5.  Continue Zyrtec if needed  6.  Return to clinic in 1 year or earlier if problem

## 2020-06-01 ENCOUNTER — Encounter: Payer: Self-pay | Admitting: Allergy and Immunology

## 2020-06-03 LAB — IGE NUT PROF. W/COMPONENT RFLX
F017-IgE Hazelnut (Filbert): <0.1 kU/L
F018-IgE Brazil Nut: <0.1 kU/L
F020-IgE Almond: <0.1 kU/L
F202-IgE Cashew Nut: <0.1 kU/L
F203-IgE Pistachio Nut: <0.1 kU/L
F256-IgE Walnut: <0.1 kU/L
Macadamia Nut, IgE: <0.1 kU/L
Peanut, IgE: <0.1 kU/L
Pecan Nut IgE: <0.1 kU/L

## 2020-06-15 ENCOUNTER — Telehealth: Payer: Self-pay

## 2020-06-15 NOTE — Telephone Encounter (Signed)
Patient called requesting lab results  Please advise

## 2020-10-21 ENCOUNTER — Encounter: Payer: Self-pay | Admitting: Sports Medicine

## 2020-10-21 ENCOUNTER — Ambulatory Visit (INDEPENDENT_AMBULATORY_CARE_PROVIDER_SITE_OTHER): Payer: 59 | Admitting: Sports Medicine

## 2020-10-21 ENCOUNTER — Other Ambulatory Visit: Payer: Self-pay

## 2020-10-21 ENCOUNTER — Ambulatory Visit (INDEPENDENT_AMBULATORY_CARE_PROVIDER_SITE_OTHER): Payer: 59

## 2020-10-21 DIAGNOSIS — E782 Mixed hyperlipidemia: Secondary | ICD-10-CM | POA: Insufficient documentation

## 2020-10-21 DIAGNOSIS — E042 Nontoxic multinodular goiter: Secondary | ICD-10-CM | POA: Insufficient documentation

## 2020-10-21 DIAGNOSIS — R011 Cardiac murmur, unspecified: Secondary | ICD-10-CM | POA: Insufficient documentation

## 2020-10-21 DIAGNOSIS — I714 Abdominal aortic aneurysm, without rupture, unspecified: Secondary | ICD-10-CM | POA: Insufficient documentation

## 2020-10-21 DIAGNOSIS — R5382 Chronic fatigue, unspecified: Secondary | ICD-10-CM | POA: Insufficient documentation

## 2020-10-21 DIAGNOSIS — I700 Atherosclerosis of aorta: Secondary | ICD-10-CM | POA: Insufficient documentation

## 2020-10-21 DIAGNOSIS — I6523 Occlusion and stenosis of bilateral carotid arteries: Secondary | ICD-10-CM | POA: Insufficient documentation

## 2020-10-21 DIAGNOSIS — M7662 Achilles tendinitis, left leg: Secondary | ICD-10-CM

## 2020-10-21 DIAGNOSIS — M7732 Calcaneal spur, left foot: Secondary | ICD-10-CM

## 2020-10-21 DIAGNOSIS — M79671 Pain in right foot: Secondary | ICD-10-CM | POA: Diagnosis not present

## 2020-10-21 DIAGNOSIS — M7751 Other enthesopathy of right foot: Secondary | ICD-10-CM

## 2020-10-21 DIAGNOSIS — M79672 Pain in left foot: Secondary | ICD-10-CM

## 2020-10-21 DIAGNOSIS — M7661 Achilles tendinitis, right leg: Secondary | ICD-10-CM | POA: Diagnosis not present

## 2020-10-21 DIAGNOSIS — R0989 Other specified symptoms and signs involving the circulatory and respiratory systems: Secondary | ICD-10-CM | POA: Insufficient documentation

## 2020-10-21 DIAGNOSIS — G9332 Myalgic encephalomyelitis/chronic fatigue syndrome: Secondary | ICD-10-CM | POA: Insufficient documentation

## 2020-10-21 DIAGNOSIS — M7750 Other enthesopathy of unspecified foot: Secondary | ICD-10-CM | POA: Diagnosis not present

## 2020-10-21 DIAGNOSIS — R5381 Other malaise: Secondary | ICD-10-CM | POA: Insufficient documentation

## 2020-10-21 MED ORDER — PREDNISONE 10 MG (21) PO TBPK: ORAL_TABLET | ORAL | 0 refills | Status: DC

## 2020-10-21 NOTE — Progress Notes (Signed)
Subjective: Kimberly Brewer is a 55 y.o. female patient who returns to office for foot pain.  Patient reports that her left heel has flared back up and she is hurting on the right foot as well on the lateral side.  Patient reports that the right foot pain is new for the last 2 weeks pain comes and goes got new shoes lost weight but the pain still remained.  Patient denies any new trauma or activity that could have exacerbated her pain or symptoms.  No other issues noted.  Patient Active Problem List   Diagnosis Date Noted  . Abdominal aortic aneurysm without rupture (HCC) 10/21/2020  . Cardiac murmur, unspecified 10/21/2020  . Cardiovascular symptoms 10/21/2020  . Chronic fatigue syndrome 10/21/2020  . Hardening of the aorta (main artery of the heart) (HCC) 10/21/2020  . Malaise 10/21/2020  . Mixed hyperlipidemia 10/21/2020  . Non-toxic multinodular goiter 10/21/2020  . Occlusion and stenosis of bilateral carotid arteries 10/21/2020  . Abnormal mammogram of right breast 08/12/2017  . Frequent headaches 06/22/2016  . Pelvic pain 06/22/2016  . Weight gain 06/22/2016  . Family history of osteoporosis 06/12/2016  . BMI 35.0-35.9,adult 12/05/2015  . History of female sterilization 12/05/2015  . Women's annual routine gynecological examination 12/05/2015    Current Outpatient Medications on File Prior to Visit  Medication Sig Dispense Refill  . cetirizine (ZYRTEC) 10 MG chewable tablet Chew 10 mg by mouth daily.    Marland Kitchen doxycycline (PERIOSTAT) 20 MG tablet Take 20 mg by mouth 2 (two) times daily.    Marland Kitchen EPINEPHrine 0.3 mg/0.3 mL IJ SOAJ injection SMARTSIG:0.3 Milligram(s) IM Once PRN    . fluocinonide (LIDEX) 0.05 % external solution Apply topically 2 (two) times daily as needed.    . Ivermectin (SOOLANTRA) 1 % CREA Apply topically.    Marland Kitchen ketoconazole (NIZORAL) 2 % shampoo APPLY TO DAMP SCALP AS DIRECTED    . latanoprost (XALATAN) 0.005 % ophthalmic solution INSTILL 1 DROP INTO AFFECTED EYE EVERY  DAY IN THE EVENING     Current Facility-Administered Medications on File Prior to Visit  Medication Dose Route Frequency Provider Last Rate Last Admin  . triamcinolone acetonide (KENALOG) 10 MG/ML injection 10 mg  10 mg Other Once Asencion Islam, DPM        Allergies  Allergen Reactions  . Penicillins Swelling  . Strawberry Flavor Swelling  . Levaquin [Levofloxacin]     Objective:  General: Alert and oriented x3 in no acute distress  Dermatology: No open lesions bilateral lower extremities, no webspace macerations, no ecchymosis bilateral, all nails x 10 are well manicured.  Vascular: Dorsalis Pedis and Posterior Tibial pedal pulses palpable, Capillary Fill Time 3 seconds,(+) pedal hair growth bilateral, no edema bilateral lower extremities, Temperature gradient within normal limits.  Neurology: Gross sensation intact via light touch bilateral, Tinel's sign noted over the sural nerve course on the right foot.  Musculoskeletal: Left greater than right tenderness at Achilles insertion and pain along the peroneal tendon course right greater than left.  Range of motion within normal limits for patient status.  Strength 5 out of 5 bilateral.  Assessment and Plan: Problem List Items Addressed This Visit   None   Visit Diagnoses    Tendonitis of ankle or foot    -  Primary   Peroneal bilateral    Relevant Orders   DG Foot Complete Right (Completed)   Tendonitis, Achilles, right       Right foot pain  Achilles tendinitis of left lower extremity       Heel spur, left       Left foot pain           -Complete examination performed -X-rays reviewed consistent with heel spur.  No fracture, no dislocation no other acute osseous findings noted. -Discussed treatment options for tendinitis with flare -Prescribed prednisone Dosepak for patient to take as instructed -Recommend gentle stretching and icing as instructed -Dispense ankle gauntlet for patient to use on the right as  directed to help with pain along the peroneal tendons -Advised patient to continue with good supportive shoes and using heel lifts as provided at this visit -Patient to return to office 2 weeks or sooner if condition worsens.  Advised patient that if pain continues may benefit from a further work-up of a MRI versus blood work since pain is bilateral and has been ongoing.   Asencion Islam, DPM

## 2020-11-02 ENCOUNTER — Other Ambulatory Visit: Payer: Self-pay | Admitting: Obstetrics & Gynecology

## 2020-11-02 DIAGNOSIS — R921 Mammographic calcification found on diagnostic imaging of breast: Secondary | ICD-10-CM

## 2020-11-09 ENCOUNTER — Encounter: Payer: Self-pay | Admitting: Sports Medicine

## 2020-11-09 ENCOUNTER — Ambulatory Visit (INDEPENDENT_AMBULATORY_CARE_PROVIDER_SITE_OTHER): Payer: 59 | Admitting: Sports Medicine

## 2020-11-09 ENCOUNTER — Other Ambulatory Visit: Payer: Self-pay

## 2020-11-09 DIAGNOSIS — M25571 Pain in right ankle and joints of right foot: Secondary | ICD-10-CM | POA: Diagnosis not present

## 2020-11-09 DIAGNOSIS — M7732 Calcaneal spur, left foot: Secondary | ICD-10-CM

## 2020-11-09 DIAGNOSIS — M7662 Achilles tendinitis, left leg: Secondary | ICD-10-CM

## 2020-11-09 DIAGNOSIS — M7750 Other enthesopathy of unspecified foot: Secondary | ICD-10-CM | POA: Diagnosis not present

## 2020-11-09 DIAGNOSIS — M79671 Pain in right foot: Secondary | ICD-10-CM | POA: Diagnosis not present

## 2020-11-09 DIAGNOSIS — M7661 Achilles tendinitis, right leg: Secondary | ICD-10-CM

## 2020-11-09 DIAGNOSIS — M79672 Pain in left foot: Secondary | ICD-10-CM

## 2020-11-09 DIAGNOSIS — M25572 Pain in left ankle and joints of left foot: Secondary | ICD-10-CM

## 2020-11-09 NOTE — Progress Notes (Signed)
Subjective: Kimberly Brewer is a 55 y.o. female patient who returns to office for foot pain.  Patient reports that her left heel continues to bother her especially when she takes a first few steps after sitting or when she goes to stand at the back of the left heel.  Patient also reports that her right foot is doing better and small amount of achy pain to the dorsal and lateral side but is much better with the brace.  Patient states that when she was taking her steroid by mouth she had no pain but as she started to taper down after a few days the pain recurred as above.  Patient denies any other pedal complaints at this time.  Patient Active Problem List   Diagnosis Date Noted  . Abdominal aortic aneurysm without rupture ([place]) 10/21/2020  . Cardiac murmur, unspecified 10/21/2020  . Cardiovascular symptoms 10/21/2020  . Chronic fatigue syndrome 10/21/2020  . Hardening of the aorta (main artery of the heart) ([place]) 10/21/2020  . Malaise 10/21/2020  . Mixed hyperlipidemia 10/21/2020  . Non-toxic multinodular goiter 10/21/2020  . Occlusion and stenosis of bilateral carotid arteries 10/21/2020  . Abnormal mammogram of right breast 08/12/2017  . Frequent headaches 06/22/2016  . Pelvic pain 06/22/2016  . Weight gain 06/22/2016  . Family history of osteoporosis 06/12/2016  . BMI 35.0-35.9,adult 12/05/2015  . History of female sterilization 12/05/2015  . Women's annual routine gynecological examination 12/05/2015    Current Outpatient Medications on File Prior to Visit  Medication Sig Dispense Refill  . cetirizine (ZYRTEC) 10 MG chewable tablet Chew 10 mg by mouth daily.    Marland Kitchen doxycycline (PERIOSTAT) 20 MG tablet Take 20 mg by mouth 2 (two) times daily.    Marland Kitchen EPINEPHrine 0.3 mg/0.3 mL IJ SOAJ injection SMARTSIG:0.3 Milligram(s) IM Once PRN    . fluocinonide (LIDEX) 0.05 % external solution Apply topically 2 (two) times daily as needed.    . Ivermectin (SOOLANTRA) 1 % CREA Apply topically.    Marland Kitchen  ketoconazole (NIZORAL) 2 % shampoo APPLY TO DAMP SCALP AS DIRECTED    . latanoprost (XALATAN) 0.005 % ophthalmic solution INSTILL 1 DROP INTO AFFECTED EYE EVERY DAY IN THE EVENING    . predniSONE (STERAPRED UNI-PAK 21 TAB) 10 MG (21) TBPK tablet Take as directed 21 tablet 0   Current Facility-Administered Medications on File Prior to Visit  Medication Dose Route Frequency Provider Last Rate Last Admin  . triamcinolone acetonide (KENALOG) 10 MG/ML injection 10 mg  10 mg Other Once Landis Martins, DPM        Allergies  Allergen Reactions  . Penicillins Swelling  . Strawberry Flavor Swelling  . Levaquin [Levofloxacin]     Objective:  General: Alert and oriented x3 in no acute distress  Dermatology: No open lesions bilateral lower extremities, no webspace macerations, no ecchymosis bilateral, all nails x 10 are well manicured.  Vascular: Dorsalis Pedis and Posterior Tibial pedal pulses palpable, Capillary Fill Time 3 seconds,(+) pedal hair growth bilateral, no edema bilateral lower extremities, Temperature gradient within normal limits.  Neurology: Johney Maine sensation intact via light touch bilateral.  Musculoskeletal: Left greater than right tenderness at Achilles insertion and decreased pain along the peroneal tendon course right greater than left.  Range of motion within normal limits for patient status.  Strength 5 out of 5 bilateral.  Assessment and Plan: Problem List Items Addressed This Visit   None   Visit Diagnoses    Pain in joints of both feet    -  Primary   Relevant Orders   Uric acid   Sedimentation rate   C-reactive protein   Rheumatoid factor   ANA   HLA-B27 Antigen   CBC with Differential/Platelet   Tendonitis of ankle or foot       Tendonitis, Achilles, right       Right foot pain       Achilles tendinitis of left lower extremity       Relevant Orders   MR ANKLE LEFT WO CONTRAST   Heel spur, left       Left foot pain           -Complete examination  performed -Re-Discussed treatment options for tendinitis worse on the left Achilles with concern for possible acute on chronic changes versus inflammation -Ordered MRI for further evaluation of left ankle to evaluate Achilles at area of most pain -Continue with ankle gauntlet on right -Continue with good supportive shoes and heel lifts and avoid strenuous activity that could aggravate her tendons -Arthritic panel ordered for further evaluation patient wants to get this blood work done at her PCP when she goes on March 10 -May continue with night splint and icing as tolerated -Patient to return to office after MRI and blood work or sooner if problems or issues arise.  Landis Martins, DPM

## 2020-11-10 ENCOUNTER — Telehealth: Payer: Self-pay | Admitting: *Deleted

## 2020-11-10 NOTE — Telephone Encounter (Signed)
-----   Message from Asencion Islam, [place] sent at 11/09/2020  1:17 PM EST ----- Regarding: FYI MRI ordered of left ankle [facility] to evaluate Achilles

## 2020-11-10 NOTE — Telephone Encounter (Signed)
Patient has an appointment on 11-25-2020 at 7:50 am for an mri. Misty Stanley

## 2020-11-22 ENCOUNTER — Ambulatory Visit
Admission: RE | Admit: 2020-11-22 | Discharge: 2020-11-22 | Disposition: A | Discharge status: Home / Self Care | Payer: 59 | Source: Ambulatory Visit | Attending: Sports Medicine | Admitting: Sports Medicine

## 2020-11-22 ENCOUNTER — Other Ambulatory Visit: Payer: Self-pay

## 2020-11-22 DIAGNOSIS — M7662 Achilles tendinitis, left leg: Secondary | ICD-10-CM

## 2020-11-25 ENCOUNTER — Other Ambulatory Visit: Payer: 59

## 2021-02-23 ENCOUNTER — Ambulatory Visit
Admission: RE | Admit: 2021-02-23 | Discharge: 2021-02-23 | Disposition: A | Discharge status: Home / Self Care | Payer: BC Managed Care – PPO | Source: Ambulatory Visit | Attending: Obstetrics & Gynecology | Admitting: Obstetrics & Gynecology

## 2021-02-23 ENCOUNTER — Other Ambulatory Visit: Payer: Self-pay

## 2021-02-23 DIAGNOSIS — R921 Mammographic calcification found on diagnostic imaging of breast: Secondary | ICD-10-CM

## 2021-07-29 IMAGING — MR MR ANKLE*L* W/O CM
4 of 5 series · 13 of 40 positions shown · non-contrast
Comparison: Radiographs 06/17/2019

CLINICAL DATA: Left heel spur for 6 months. Previous steroid
therapy. Achilles tendinitis.

EXAM:
MRI OF THE LEFT ANKLE WITHOUT CONTRAST
TECHNIQUE: Multiplanar, multisequence MR imaging of the ankle was performed. No
intravenous contrast was administered.

[Series 3: T1 · axial · left · 3.0mm · 0.27mm/px · z∈[-41,+68]mm · 3 of 36 slices shown (1 of 2)]
[im 4/36]
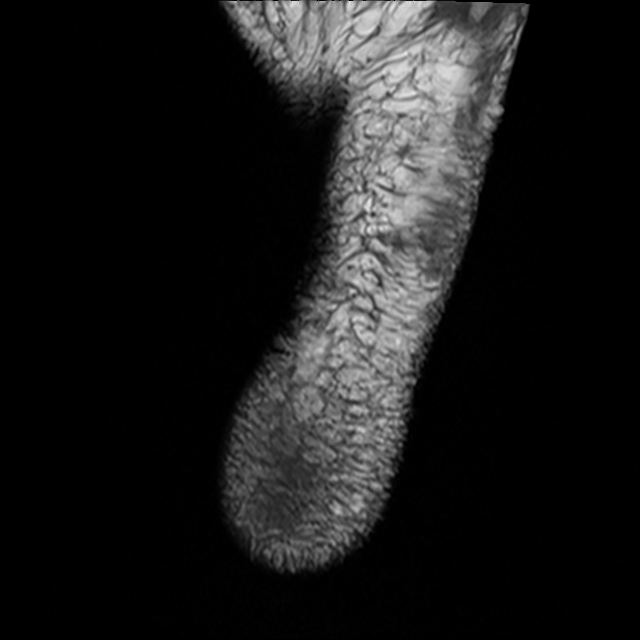
[im 20/36]
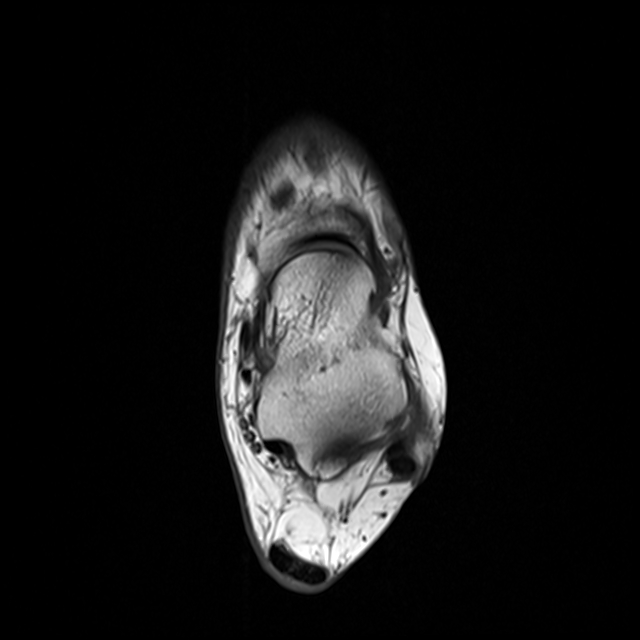
[im 32/36]
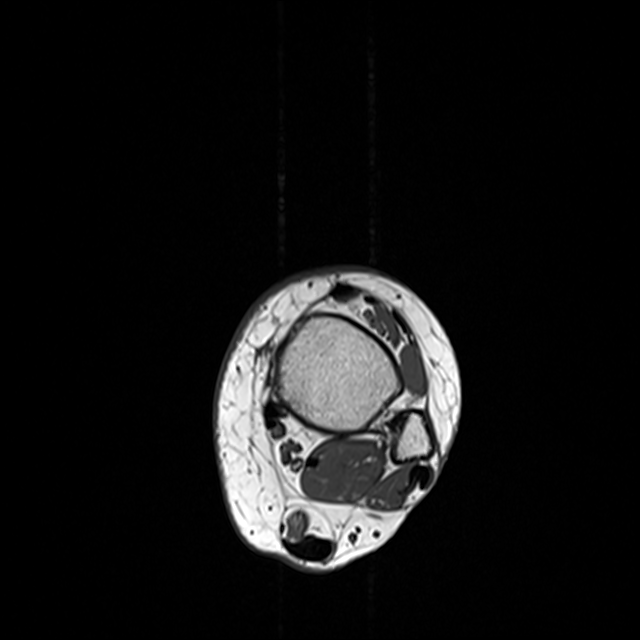

[Series 4: T2 fat-sat · axial · left · 3.0mm · 0.27mm/px · z∈[-53,+64]mm · 4 of 36 slices shown (1 of 2)]
[im 1/36]
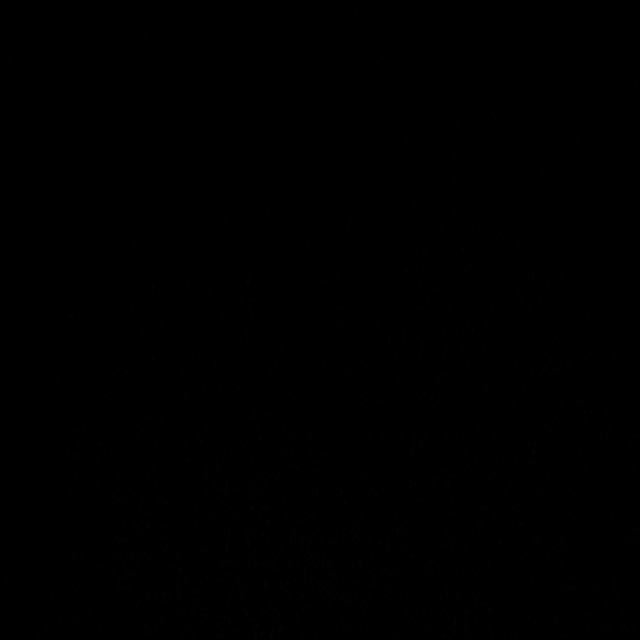
[im 5/36]
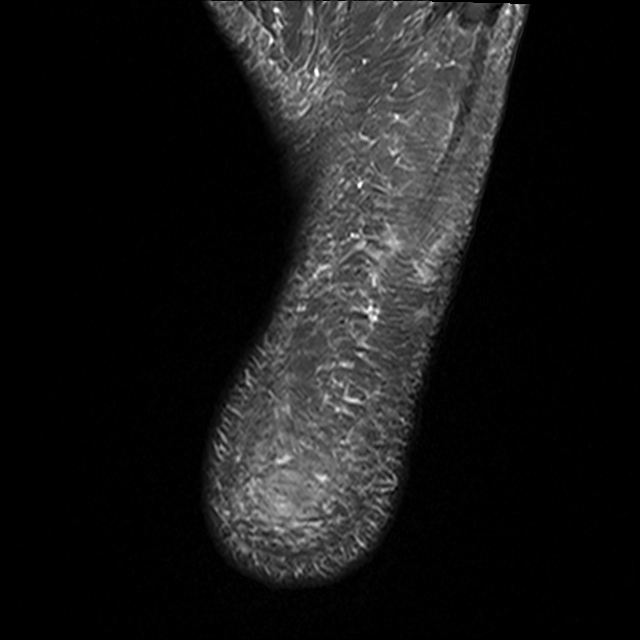
[im 18/36]
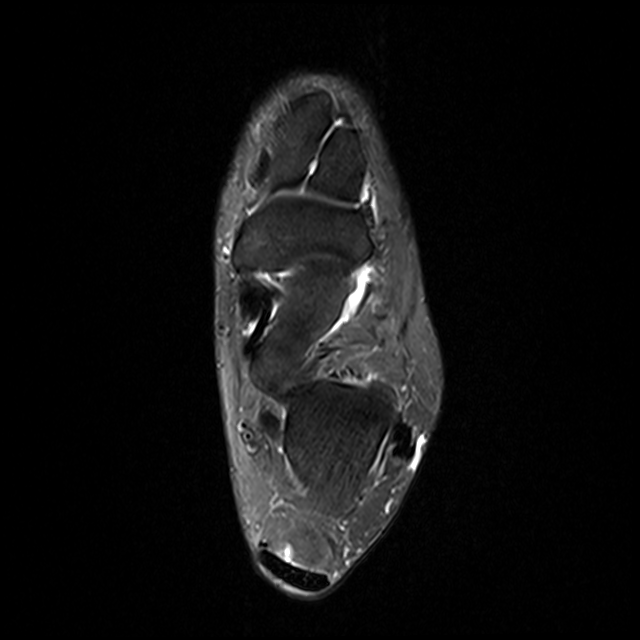
[im 31/36]
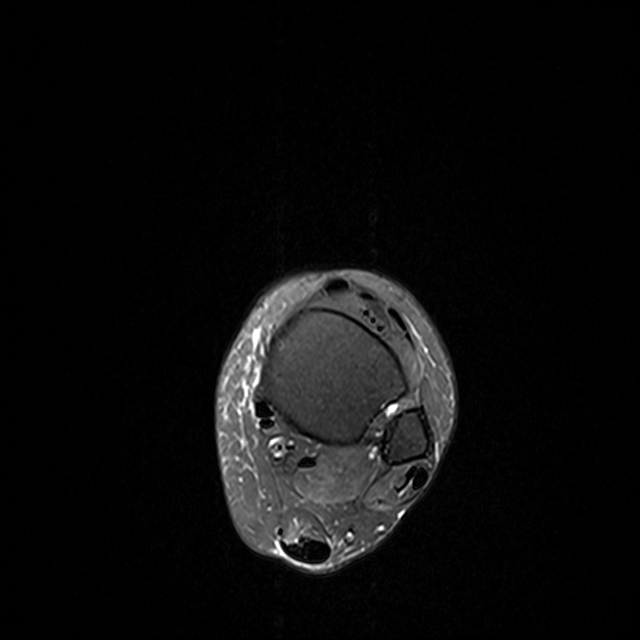

[Series 5: T1 · sagittal · left · 4.0mm · 0.27mm/px · 3 of 20 slices shown (2 of 2)]
[im 1/20]
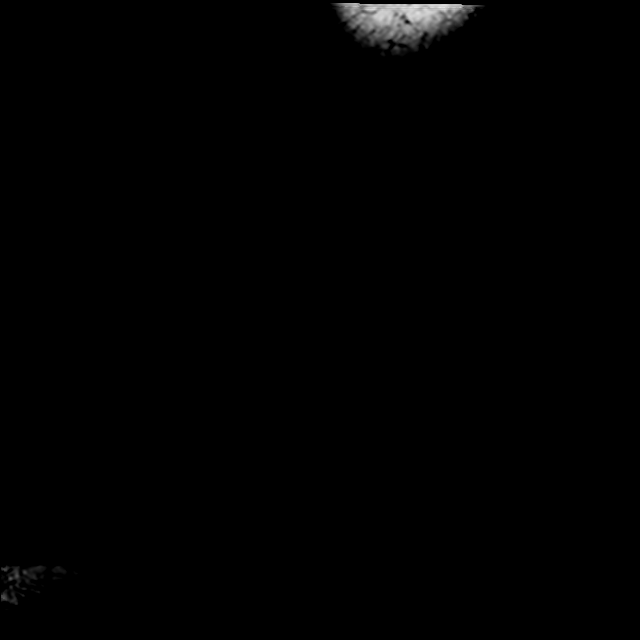
[im 10/20]
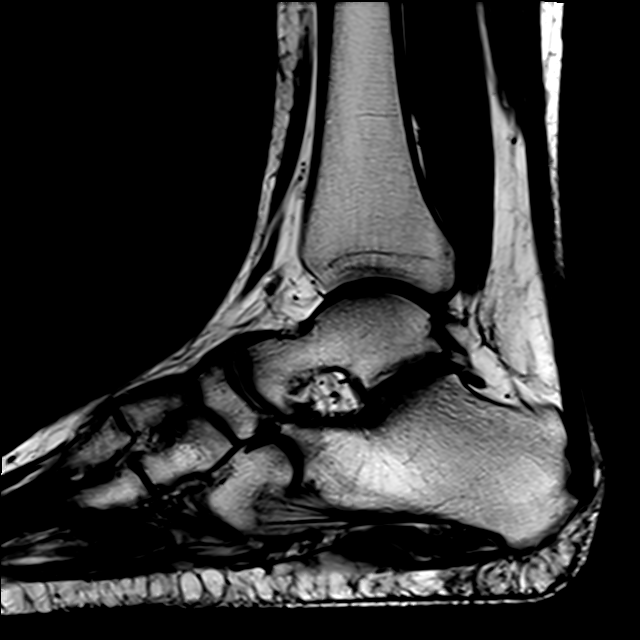
[im 20/20]
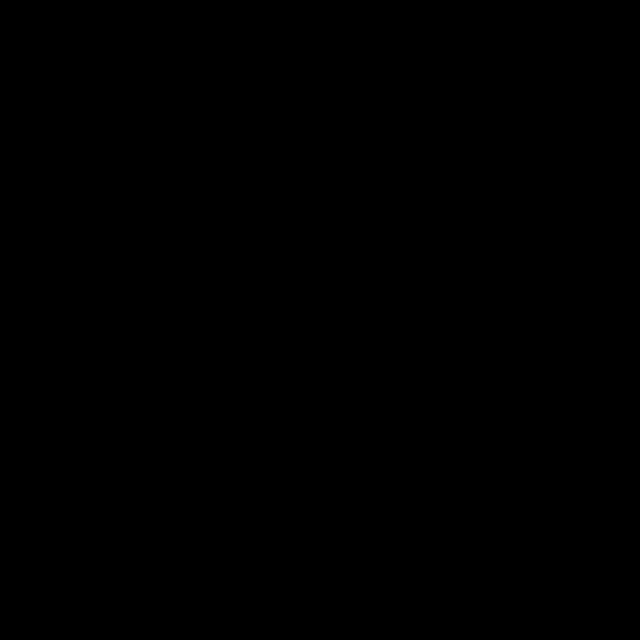

[Series 7: T2 fat-sat · coronal · left · 3.0mm · 0.25mm/px · 3 of 41 slices shown (2 of 2)]
[im 5/41]
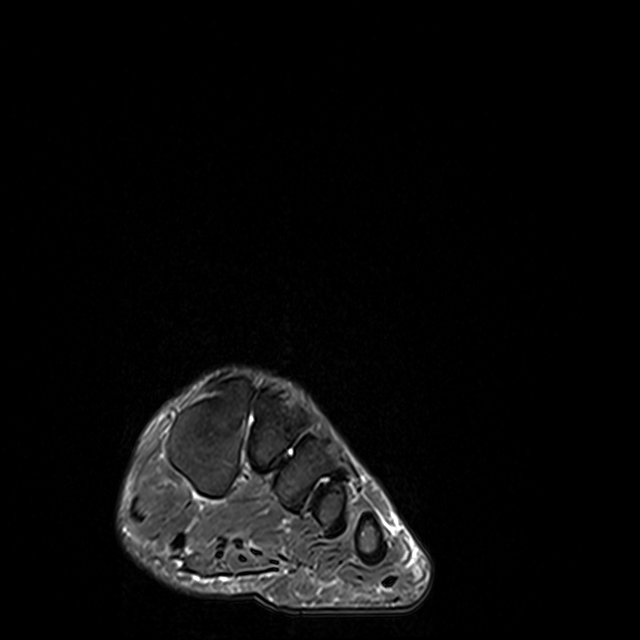
[im 21/41]
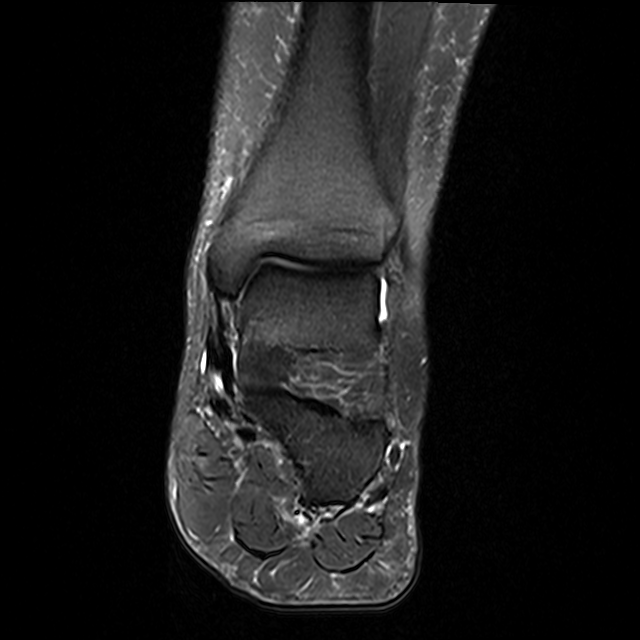
[im 37/41]
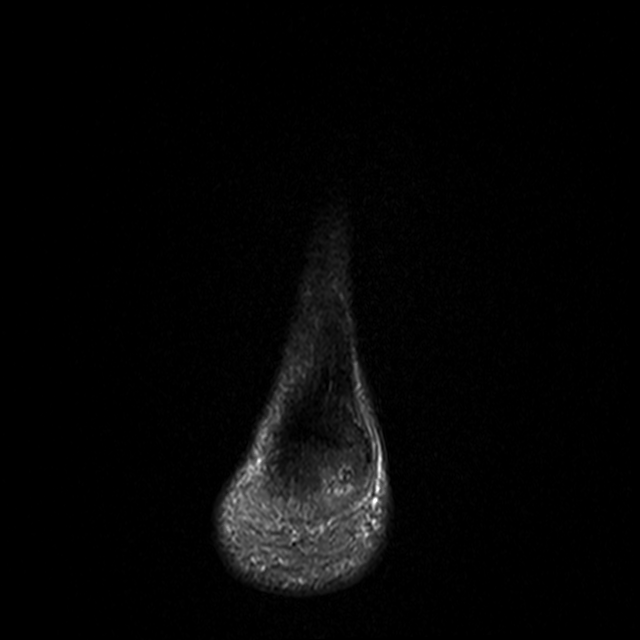

[13 of 40 positions shown; findings below may reference images not displayed]

FINDINGS: TENDONS

Peroneal: Intact and normally positioned.

Posteromedial: Intact and normally positioned.

Anterior: Intact and normally positioned.

Achilles: Intact without significant tendinosis or tearing. There is
a small posterior calcaneal spur.

Plantar Fascia: Intact. Mild overlying subcutaneous edema. There is
a small plantar calcaneal spur without associated marrow edema.

LIGAMENTS

Lateral: The anterior and posterior talofibular and calcaneofibular
ligaments are intact.

Medial: The deltoid and visualized portions of the spring ligament
appear intact.

CARTILAGE AND BONES

Ankle Joint: No significant ankle joint effusion. The talar dome and
tibial plafond are intact.

Subtalar Joints/Sinus Tarsi: Unremarkable.

Bones: No significant extra-articular osseous findings.

Other: No significant soft tissue findings.
IMPRESSION: 1. No acute findings or explanation for the patient's symptoms. The
Achilles tendon and plantar fascia appear normal.
2. Small posterior calcaneal and plantar calcaneal spurs without
reactive marrow edema.

## 2021-09-17 HISTORY — PX: KNEE ARTHROSCOPY: SHX127

## 2022-01-22 ENCOUNTER — Other Ambulatory Visit: Payer: Self-pay | Admitting: Obstetrics & Gynecology

## 2022-01-22 DIAGNOSIS — Z1231 Encounter for screening mammogram for malignant neoplasm of breast: Secondary | ICD-10-CM

## 2022-02-26 ENCOUNTER — Ambulatory Visit
Admission: RE | Admit: 2022-02-26 | Discharge: 2022-02-26 | Disposition: A | Discharge status: Home / Self Care | Payer: Commercial Managed Care - PPO | Source: Ambulatory Visit | Attending: Obstetrics & Gynecology | Admitting: Obstetrics & Gynecology

## 2022-02-26 DIAGNOSIS — Z1231 Encounter for screening mammogram for malignant neoplasm of breast: Secondary | ICD-10-CM

## 2023-02-04 ENCOUNTER — Other Ambulatory Visit: Payer: Self-pay | Admitting: Obstetrics & Gynecology

## 2023-02-04 DIAGNOSIS — Z1231 Encounter for screening mammogram for malignant neoplasm of breast: Secondary | ICD-10-CM

## 2023-03-01 ENCOUNTER — Ambulatory Visit: Payer: BC Managed Care – PPO

## 2023-03-13 ENCOUNTER — Ambulatory Visit: Payer: BC Managed Care – PPO

## 2023-03-29 ENCOUNTER — Ambulatory Visit
Admission: RE | Admit: 2023-03-29 | Discharge: 2023-03-29 | Disposition: A | Discharge status: Home / Self Care | Payer: Commercial Managed Care - PPO | Source: Ambulatory Visit | Attending: Obstetrics & Gynecology | Admitting: Obstetrics & Gynecology

## 2023-03-29 DIAGNOSIS — Z1231 Encounter for screening mammogram for malignant neoplasm of breast: Secondary | ICD-10-CM

## 2023-07-19 HISTORY — PX: KNEE ARTHROSCOPY: SHX127

## 2023-12-04 ENCOUNTER — Other Ambulatory Visit: Payer: Self-pay | Admitting: Orthopedic Surgery

## 2024-01-31 ENCOUNTER — Other Ambulatory Visit: Payer: Self-pay

## 2024-01-31 ENCOUNTER — Encounter (HOSPITAL_BASED_OUTPATIENT_CLINIC_OR_DEPARTMENT_OTHER): Payer: Self-pay | Admitting: Orthopedic Surgery

## 2024-02-06 ENCOUNTER — Other Ambulatory Visit: Payer: Self-pay

## 2024-02-06 ENCOUNTER — Ambulatory Visit (HOSPITAL_BASED_OUTPATIENT_CLINIC_OR_DEPARTMENT_OTHER): Payer: Self-pay | Admitting: Anesthesiology

## 2024-02-06 ENCOUNTER — Ambulatory Visit (HOSPITAL_BASED_OUTPATIENT_CLINIC_OR_DEPARTMENT_OTHER)
Admission: RE | Admit: 2024-02-06 | Discharge: 2024-02-06 | Disposition: A | Discharge status: Home / Self Care | Attending: Orthopedic Surgery | Admitting: Orthopedic Surgery

## 2024-02-06 ENCOUNTER — Encounter (HOSPITAL_BASED_OUTPATIENT_CLINIC_OR_DEPARTMENT_OTHER): Payer: Self-pay | Admitting: Orthopedic Surgery

## 2024-02-06 ENCOUNTER — Encounter (HOSPITAL_BASED_OUTPATIENT_CLINIC_OR_DEPARTMENT_OTHER)
Admission: RE | Disposition: A | Discharge status: Home / Self Care | Payer: Self-pay | Source: Home / Self Care | Attending: Orthopedic Surgery

## 2024-02-06 DIAGNOSIS — M19041 Primary osteoarthritis, right hand: Secondary | ICD-10-CM

## 2024-02-06 DIAGNOSIS — Z01818 Encounter for other preprocedural examination: Secondary | ICD-10-CM

## 2024-02-06 DIAGNOSIS — M6740 Ganglion, unspecified site: Secondary | ICD-10-CM | POA: Diagnosis present

## 2024-02-06 DIAGNOSIS — M151 Heberden's nodes (with arthropathy): Secondary | ICD-10-CM | POA: Diagnosis present

## 2024-02-06 DIAGNOSIS — M67441 Ganglion, right hand: Secondary | ICD-10-CM | POA: Insufficient documentation

## 2024-02-06 HISTORY — PX: CYST REMOVAL HAND: SHX6279

## 2024-02-06 HISTORY — DX: Unspecified osteoarthritis, unspecified site: M19.90

## 2024-02-06 HISTORY — PX: ARTHROTOMY: SHX5728

## 2024-02-06 SURGERY — REMOVAL, CYST, HAND
Anesthesia: General | Site: Middle Finger | Laterality: Right

## 2024-02-06 MED ORDER — LACTATED RINGERS IV SOLN: INTRAVENOUS | Status: DC | PRN

## 2024-02-06 MED ORDER — CEFAZOLIN SODIUM-DEXTROSE 2-3 GM-%(50ML) IV SOLR
INTRAVENOUS | Status: DC | PRN
  Administered 2024-02-06: 2 g via INTRAVENOUS

## 2024-02-06 MED ORDER — CEFAZOLIN SODIUM-DEXTROSE 2-4 GM/100ML-% IV SOLN
INTRAVENOUS | Status: AC
  Filled 2024-02-06: qty 100

## 2024-02-06 MED ORDER — ONDANSETRON HCL 4 MG/2ML IJ SOLN
INTRAMUSCULAR | Status: AC
  Filled 2024-02-06: qty 2

## 2024-02-06 MED ORDER — LIDOCAINE 2% (20 MG/ML) 5 ML SYRINGE
INTRAMUSCULAR | Status: AC
  Filled 2024-02-06: qty 5

## 2024-02-06 MED ORDER — BUPIVACAINE HCL (PF) 0.25 % IJ SOLN
INTRAMUSCULAR | Status: AC
  Filled 2024-02-06: qty 30

## 2024-02-06 MED ORDER — HYDROCODONE-ACETAMINOPHEN 5-325 MG PO TABS: 1.0000 | ORAL_TABLET | Freq: Four times a day (QID) | ORAL | 0 refills | Status: AC | PRN

## 2024-02-06 MED ORDER — LIDOCAINE 2% (20 MG/ML) 5 ML SYRINGE
INTRAMUSCULAR | Status: DC | PRN
  Administered 2024-02-06: 60 mg via INTRAVENOUS

## 2024-02-06 MED ORDER — GLYCOPYRROLATE 0.2 MG/ML IJ SOLN
INTRAMUSCULAR | Status: DC | PRN
  Administered 2024-02-06: .2 mg via INTRAVENOUS

## 2024-02-06 MED ORDER — HYDROMORPHONE HCL 1 MG/ML IJ SOLN: 0.2500 mg | INTRAMUSCULAR | Status: DC | PRN

## 2024-02-06 MED ORDER — ACETAMINOPHEN 500 MG PO TABS
1000.0000 mg | ORAL_TABLET | Freq: Once | ORAL | Status: AC
  Administered 2024-02-06: 1000 mg via ORAL

## 2024-02-06 MED ORDER — PHENYLEPHRINE 80 MCG/ML (10ML) SYRINGE FOR IV PUSH (FOR BLOOD PRESSURE SUPPORT)
PREFILLED_SYRINGE | INTRAVENOUS | Status: AC
  Filled 2024-02-06: qty 10

## 2024-02-06 MED ORDER — ACETAMINOPHEN 500 MG PO TABS
ORAL_TABLET | ORAL | Status: AC
  Filled 2024-02-06: qty 2

## 2024-02-06 MED ORDER — PROPOFOL 10 MG/ML IV BOLUS
INTRAVENOUS | Status: DC | PRN
  Administered 2024-02-06: 200 ug/kg/min via INTRAVENOUS
  Administered 2024-02-06: 50 mg via INTRAVENOUS
  Administered 2024-02-06: 150 mg via INTRAVENOUS

## 2024-02-06 MED ORDER — PHENYLEPHRINE 80 MCG/ML (10ML) SYRINGE FOR IV PUSH (FOR BLOOD PRESSURE SUPPORT)
PREFILLED_SYRINGE | INTRAVENOUS | Status: DC | PRN
  Administered 2024-02-06: 160 ug via INTRAVENOUS
  Administered 2024-02-06: 80 ug via INTRAVENOUS

## 2024-02-06 MED ORDER — ONDANSETRON HCL 4 MG/2ML IJ SOLN
INTRAMUSCULAR | Status: DC | PRN
  Administered 2024-02-06: 4 mg via INTRAVENOUS

## 2024-02-06 MED ORDER — MIDAZOLAM HCL 2 MG/2ML IJ SOLN
INTRAMUSCULAR | Status: AC
  Filled 2024-02-06: qty 2

## 2024-02-06 MED ORDER — 0.9 % SODIUM CHLORIDE (POUR BTL) OPTIME
TOPICAL | Status: DC | PRN
  Administered 2024-02-06: 1000 mL

## 2024-02-06 MED ORDER — EPHEDRINE 5 MG/ML INJ
INTRAVENOUS | Status: AC
  Filled 2024-02-06: qty 5

## 2024-02-06 MED ORDER — KETOROLAC TROMETHAMINE 30 MG/ML IJ SOLN
INTRAMUSCULAR | Status: AC
  Filled 2024-02-06: qty 1

## 2024-02-06 MED ORDER — BUPIVACAINE HCL (PF) 0.25 % IJ SOLN
INTRAMUSCULAR | Status: DC | PRN
  Administered 2024-02-06: 9 mL

## 2024-02-06 MED ORDER — MIDAZOLAM HCL 5 MG/5ML IJ SOLN
INTRAMUSCULAR | Status: DC | PRN
  Administered 2024-02-06: 2 mg via INTRAVENOUS

## 2024-02-06 MED ORDER — FENTANYL CITRATE (PF) 100 MCG/2ML IJ SOLN
INTRAMUSCULAR | Status: DC | PRN
  Administered 2024-02-06 (×2): 50 ug via INTRAVENOUS

## 2024-02-06 MED ORDER — LACTATED RINGERS IV SOLN: INTRAVENOUS | Status: DC

## 2024-02-06 MED ORDER — EPHEDRINE SULFATE (PRESSORS) 50 MG/ML IJ SOLN
INTRAMUSCULAR | Status: DC | PRN
  Administered 2024-02-06 (×4): 10 mg via INTRAVENOUS

## 2024-02-06 MED ORDER — FENTANYL CITRATE (PF) 100 MCG/2ML IJ SOLN
INTRAMUSCULAR | Status: AC
  Filled 2024-02-06: qty 2

## 2024-02-06 SURGICAL SUPPLY — 39 items
BANDAGE GAUZE 1X75IN STRL (MISCELLANEOUS) IMPLANT
BENZOIN TINCTURE PRP APPL 2/3 (GAUZE/BANDAGES/DRESSINGS) IMPLANT
BLADE MINI RND TIP GREEN BEAV (BLADE) IMPLANT
BLADE SURG 15 STRL LF DISP TIS (BLADE) ×2 IMPLANT
BNDG COHESIVE 1X5 TAN STRL LF (GAUZE/BANDAGES/DRESSINGS) IMPLANT
BNDG COHESIVE 2X5 TAN ST LF (GAUZE/BANDAGES/DRESSINGS) IMPLANT
BNDG ELASTIC 2INX 5YD STR LF (GAUZE/BANDAGES/DRESSINGS) IMPLANT
BNDG ELASTIC 3INX 5YD STR LF (GAUZE/BANDAGES/DRESSINGS) IMPLANT
BNDG ESMARK 4X9 LF (GAUZE/BANDAGES/DRESSINGS) IMPLANT
BNDG GAUZE DERMACEA FLUFF 4 (GAUZE/BANDAGES/DRESSINGS) IMPLANT
BNDG PLASTER X FAST 3X3 WHT LF (CAST SUPPLIES) IMPLANT
CHLORAPREP W/TINT 26 (MISCELLANEOUS) ×1 IMPLANT
CORD BIPOLAR FORCEPS 12FT (ELECTRODE) ×1 IMPLANT
COVER BACK TABLE 60X90IN (DRAPES) ×1 IMPLANT
COVER MAYO STAND STRL (DRAPES) ×1 IMPLANT
CUFF TOURN SGL QUICK 18X4 (TOURNIQUET CUFF) ×1 IMPLANT
DRAPE EXTREMITY T 121X128X90 (DISPOSABLE) ×1 IMPLANT
DRAPE SURG 17X23 STRL (DRAPES) ×1 IMPLANT
GAUZE SPONGE 4X4 12PLY STRL (GAUZE/BANDAGES/DRESSINGS) ×1 IMPLANT
GAUZE STRETCH 2X75IN STRL (MISCELLANEOUS) IMPLANT
GAUZE XEROFORM 1X8 LF (GAUZE/BANDAGES/DRESSINGS) ×1 IMPLANT
GLOVE BIO SURGEON STRL SZ7.5 (GLOVE) ×1 IMPLANT
GLOVE BIOGEL PI IND STRL 8 (GLOVE) ×1 IMPLANT
GOWN STRL REUS W/ TWL LRG LVL3 (GOWN DISPOSABLE) ×1 IMPLANT
NDL HYPO 25X1 1.5 SAFETY (NEEDLE) ×1 IMPLANT
NEEDLE HYPO 25X1 1.5 SAFETY (NEEDLE) ×1 IMPLANT
NS IRRIG 1000ML POUR BTL (IV SOLUTION) ×1 IMPLANT
PACK BASIN DAY SURGERY FS (CUSTOM PROCEDURE TRAY) ×1 IMPLANT
PAD CAST 3X4 CTTN HI CHSV (CAST SUPPLIES) IMPLANT
PAD CAST 4YDX4 CTTN HI CHSV (CAST SUPPLIES) IMPLANT
PADDING CAST ABS COTTON 4X4 ST (CAST SUPPLIES) ×1 IMPLANT
STOCKINETTE 4X48 STRL (DRAPES) ×1 IMPLANT
STRIP CLOSURE SKIN 1/2X4 (GAUZE/BANDAGES/DRESSINGS) IMPLANT
SUT ETHILON 3 0 PS 1 (SUTURE) IMPLANT
SUT ETHILON 4 0 PS 2 18 (SUTURE) ×1 IMPLANT
SYR BULB EAR ULCER 3OZ GRN STR (SYRINGE) ×1 IMPLANT
SYR CONTROL 10ML LL (SYRINGE) ×1 IMPLANT
TOWEL GREEN STERILE FF (TOWEL DISPOSABLE) ×2 IMPLANT
UNDERPAD 30X36 HEAVY ABSORB (UNDERPADS AND DIAPERS) ×1 IMPLANT

## 2024-02-06 NOTE — Anesthesia Preprocedure Evaluation (Addendum)
 Anesthesia Evaluation  Patient identified by MRN, date of birth, ID band Patient awake    Reviewed: Allergy  & Precautions, H&P , NPO status , Patient's Chart, lab work & pertinent test results  Airway Mallampati: III  TM Distance: >3 FB Neck ROM: Full    Dental no notable dental hx. (+) Teeth Intact, Dental Advisory Given   Pulmonary neg pulmonary ROS   Pulmonary exam normal breath sounds clear to auscultation       Cardiovascular negative cardio ROS  Rhythm:Regular Rate:Normal     Neuro/Psych  Headaches  negative psych ROS   GI/Hepatic negative GI ROS, Neg liver ROS,,,  Endo/Other  negative endocrine ROS    Renal/GU negative Renal ROS  negative genitourinary   Musculoskeletal  (+) Arthritis , Osteoarthritis,    Abdominal   Peds  Hematology negative hematology ROS (+)   Anesthesia Other Findings   Reproductive/Obstetrics negative OB ROS                             Anesthesia Physical Anesthesia Plan  ASA: 2  Anesthesia Plan: General   Post-op Pain Management: Tylenol  PO (pre-op)* and Toradol IV (intra-op)*   Induction: Intravenous  PONV Risk Score and Plan: 4 or greater and Ondansetron , Dexamethasone , Propofol  infusion, TIVA and Midazolam   Airway Management Planned: LMA  Additional Equipment:   Intra-op Plan:   Post-operative Plan: Extubation in OR  Informed Consent: I have reviewed the patients History and Physical, chart, labs and discussed the procedure including the risks, benefits and alternatives for the proposed anesthesia with the patient or authorized representative who has indicated his/her understanding and acceptance.     Dental advisory given  Plan Discussed with: CRNA  Anesthesia Plan Comments:        Anesthesia Quick Evaluation

## 2024-02-06 NOTE — Op Note (Signed)
 NAME: Kimberly Brewer MEDICAL RECORD NO: [id number] DATE OF BIRTH: [birth year] FACILITY: [facility] LOCATION: [facility] PHYSICIAN: Trannie Bardales R. 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  Right long finger excision mucoid cyst 2.  Right long finger debridement of DIP joint   SURGEON:  Brunilda Capra, M.D.   ASSISTANT: none   ANESTHESIA:  General   INTRAVENOUS FLUIDS:  Per anesthesia flow sheet.   ESTIMATED BLOOD LOSS:  Minimal.   COMPLICATIONS:  None.   SPECIMENS: Right long finger mucoid cyst to pathology   TOURNIQUET TIME:   Right forearm: Less than 15 minutes at 250 mmHg   DISPOSITION:  Stable to PACU.   INDICATIONS: 58 year old female with right long finger mucoid cyst MP joint arthritis.  She wishes to have the cyst removed and the DIP joint debrided to try to prevent recurrence.  Risks, benefits and alternatives of surgery were discussed including the risks of blood loss, infection, damage to nerves, vessels, tendons, ligaments, bone for surgery, need for additional surgery, complications with wound healing, continued pain, stiffness, , recurrence.  She voiced understanding of these risks and elected to proceed.  OPERATIVE COURSE:  After being identified preoperatively by myself,  the patient and I agreed on the procedure and site of the procedure.  The surgical site was marked.  Surgical consent had been signed. Preoperative IV antibiotic prophylaxis was given. She was transferred to the operating room and placed on the operating table in supine position with the right upper extremity on an arm board.  General anesthesia was induced by the anesthesiologist.  Right upper extremity was prepped and draped in normal sterile orthopedic fashion.  A surgical pause was performed  between the surgeons, anesthesia, and operating room staff and all were in agreement as to the patient, procedure, and site of procedure.  Tourniquet at the proximal aspect of the forearm was inflated to 250 mmHg after exsanguination of the arm with an Esmarch bandage.  A hockey-stick shaped incision was made over the DIP joint of the long finger and carried in subcutaneous tissues by spreading technique.  The cyst was identified.  It was filled with clear gelatinous fluid.  Was freed up from surrounding soft tissues.  Was removed with the synovectomy rongeurs.  The DIP joint was entered underneath the extensor tendon.  The synovectomy rongeurs were used to perform a synovectomy and debride prominent bone at the dorsal radial aspect of the middle phalanx.  The wound and joint were copiously irrigated with sterile saline.  The wound was closed with 4-0 nylon in a horizontal mattress fashion.  Digital block was performed with quarter percent plain Marcaine  to aid in postoperative analgesia.  Wound was dressed with sterile Xeroform 4 x 4's and wrapped with a Coban dressing lightly.  AlumaFoam splint was placed and wrapped lightly with Coban dressing.  The tourniquet was deflated at less than 15 minutes.  Fingertips were pink with brisk capillary refill after deflation of tourniquet.  The operative  drapes were broken down.  The patient was awoken from anesthesia safely.  She was transferred back to the stretcher and taken to PACU in stable condition.  I will see her back in the office in 1 week for postoperative followup.  I will give her a prescription for Norco  5/325 1 tab PO q6 hours prn pain, dispense #15.   Moiz Ryant, MD Electronically signed, 02/06/24

## 2024-02-06 NOTE — Discharge Instructions (Addendum)
 [facility] Instructions Hand Surgery  Wound Care: Keep your hand elevated above the level of your heart.  Do not allow it to dangle by your side.  Keep the dressing dry and do not remove it unless your doctor advises you to do so.  He will usually change it at the time of your post-op visit.  Moving your fingers is advised to stimulate circulation but will depend on the site of your surgery.  If you have a splint applied, your doctor will advise you regarding movement.  Activity: Do not drive or operate machinery today.  Rest today and then you may return to your normal activity and work as indicated by your physician.  Diet:  Drink liquids today or eat a light diet.  You may resume a regular diet tomorrow.    General expectations: Pain for two to three days. Fingers may become slightly swollen.  Call your doctor if any of the following occur: Severe pain not relieved by pain medication. Elevated temperature. Dressing soaked with blood. Inability to move fingers. White or bluish color to fingers.     Post Anesthesia Home Care Instructions  Activity: Get plenty of rest for the remainder of the day. A responsible individual must stay with you for 24 hours following the procedure.  For the next 24 hours, DO NOT: -Drive a car -Advertising copywriter -Drink alcoholic beverages -Take any medication unless instructed by your physician -Make any legal decisions or sign important papers.  Meals: Start with liquid foods such as gelatin or soup. Progress to regular foods as tolerated. Avoid greasy, spicy, heavy foods. If nausea and/or vomiting occur, drink only clear liquids until the nausea and/or vomiting subsides. Call your physician if vomiting continues.  Special Instructions/Symptoms: Your throat may feel dry or sore from the anesthesia or the breathing tube placed in your throat during surgery. If this causes discomfort, gargle with warm salt water. The discomfort should disappear  within 24 hours.  If you had a scopolamine  patch placed behind your ear for the management of post- operative nausea and/or vomiting:  1. The medication in the patch is effective for 72 hours, after which it should be removed.  Wrap patch in a tissue and discard in the trash. Wash hands thoroughly with soap and water. 2. You may remove the patch earlier than 72 hours if you experience unpleasant side effects which may include dry mouth, dizziness or visual disturbances. 3. Avoid touching the patch. Wash your hands with soap and water after contact with the patch.     Next dose of Tylenol  may be given at 1:30pm if needed.

## 2024-02-06 NOTE — H&P (Signed)
 Kimberly Brewer is an 58 y.o. female.   Chief Complaint: mucoid cyst HPI: 58 yo female with right long finger mucoid cyst and dip arthritis.  It is bothersome to her.  She wishes to proceed with excision of the cyst and debridement of the dip joint to try to prevent recurrence.  Allergies:  Allergies  Allergen Reactions   Penicillins Swelling   Strawberry Flavoring Agent (Non-Screening) Swelling   Levaquin [Levofloxacin]    Peanut-Containing Drug Products     Past Medical History:  Diagnosis Date   Arthritis    arthritis   Glaucoma    Rosacea     Past Surgical History:  Procedure Laterality Date   APPENDECTOMY     BREAST LUMPECTOMY WITH RADIOACTIVE SEED LOCALIZATION Left 04/09/2019   Procedure: LEFT BREAST LUMPECTOMY WITH RADIOACTIVE SEED LOCALIZATION;  Surgeon: Oza Blumenthal, MD;  Location: [facility];  Service: General;  Laterality: Left;   BREAST REDUCTION SURGERY Bilateral 08/28/2017   Procedure: BILATERAL MAMMARY REDUCTION  (BREAST);  Surgeon: Contogiannis, Kevon Pellegrini, MD;  Location: [facility];  Service: Plastics;  Laterality: Bilateral;   KNEE ARTHROSCOPY Right 2023   [facility]   KNEE ARTHROSCOPY Left 07/2023   [facility]   LIPOSUCTION Bilateral 08/28/2017   Procedure: LIPOSUCTION;  Surgeon: Contogiannis, Kevon Pellegrini, MD;  Location: [facility];  Service: Plastics;  Laterality: Bilateral;   NASAL SINUS SURGERY     REDUCTION MAMMAPLASTY Bilateral 2018   TUBAL LIGATION      Family History: History reviewed. No pertinent family history.  Social History:   reports that she has never smoked. She has never used smokeless tobacco. She reports that she does not drink alcohol and does not use drugs.  Medications: Medications Prior to Admission  Medication Sig Dispense Refill   doxycycline (ADOXA) 100 MG tablet Take 100 mg by mouth 2 (two) times daily. Sinus infection     doxycycline (PERIOSTAT) 20 MG tablet Take 20 mg by mouth 2 (two) times  daily. For rosacea     Ivermectin (SOOLANTRA) 1 % CREA Apply topically.     levocetirizine (XYZAL) 5 MG tablet Take 5 mg by mouth every evening.     Semaglutide-Weight Management (WEGOVY) 1 MG/0.5ML SOAJ Inject 1 mg into the skin.     Vitamin D, Ergocalciferol, (DRISDOL) 1.25 MG (50000 UNIT) CAPS capsule Take 50,000 Units by mouth every 7 (seven) days.     EPINEPHrine  0.3 mg/0.3 mL IJ SOAJ injection SMARTSIG:0.3 Milligram(s) IM Once PRN      No results found for this or any previous visit (from the past 48 hours).  No results found.    Blood pressure 128/82, pulse (!) 52, temperature (!) 97.5 F (36.4 C), temperature source Temporal, resp. rate 16, height 5\' 8"  (1.727 m), weight 100.7 kg, SpO2 99%.  General appearance: alert, cooperative, and appears stated age Head: Normocephalic, without obvious abnormality, atraumatic Neck: supple, symmetrical, trachea midline Extremities: Intact sensation and capillary refill all digits.  +epl/fpl/io.  No wounds.  Skin: Skin color, texture, turgor normal. No rashes or lesions Neurologic: Grossly normal Incision/Wound: none  Assessment/Plan Right long finger mucoid cyst and dip arthritis.  Non operative and operative treatment options have been discussed with the patient and patient wishes to proceed with operative treatment. Risks, benefits, and alternatives of surgery have been discussed and the patient agrees with the plan of care.   Kimberly Brewer 02/06/2024, 8:15 AM

## 2024-02-06 NOTE — Anesthesia Postprocedure Evaluation (Signed)
 Anesthesia Post Note  Patient: Kimberly Brewer  Procedure(s) Performed: REMOVAL, CYST, HAND (Right: Middle Finger) ARTHROTOMY (Right: Middle Finger)     Patient location during evaluation: PACU Anesthesia Type: General Level of consciousness: awake and alert Pain management: pain level controlled Vital Signs Assessment: post-procedure vital signs reviewed and stable Respiratory status: spontaneous breathing, nonlabored ventilation and respiratory function stable Cardiovascular status: blood pressure returned to baseline and stable Postop Assessment: no apparent nausea or vomiting Anesthetic complications: no  No notable events documented.  Last Vitals:  Vitals:   02/06/24 1037 02/06/24 1049  BP: 104/60 (!) 110/55  Pulse: 63 (!) 52  Resp: 12 20  Temp:  (!) 36.1 C  SpO2: 96% 95%    Last Pain:  Vitals:   02/06/24 1049  TempSrc: Temporal  PainSc: 0-No pain                 Marley Pakula,W. EDMOND

## 2024-02-06 NOTE — Transfer of Care (Signed)
 Immediate Anesthesia Transfer of Care Note  Patient: Kimberly Brewer  Procedure(s) Performed: REMOVAL, CYST, HAND (Right: Middle Finger) ARTHROTOMY (Right: Middle Finger)  Patient Location: PACU  Anesthesia Type:General  Level of Consciousness: awake, oriented, and patient cooperative  Airway & Oxygen Therapy: Patient Spontanous Breathing and Patient connected to face mask oxygen  Post-op Assessment: Report given to RN, Post -op Vital signs reviewed and stable, and Patient moving all extremities  Post vital signs: Reviewed and stable  Last Vitals:  Vitals Value Taken Time  BP 101/48 02/06/24 1021  Temp 36.1 C 02/06/24 1021  Pulse 71 02/06/24 1026  Resp 15 02/06/24 1026  SpO2 100 % 02/06/24 1026  Vitals shown include unfiled device data.  Last Pain:  Vitals:   02/06/24 1021  TempSrc:   PainSc: 0-No pain      Patients Stated Pain Goal: 1 (02/06/24 0728)  Complications: No notable events documented.

## 2024-02-06 NOTE — Anesthesia Procedure Notes (Signed)
 Procedure Name: LMA Insertion Date/Time: 02/06/2024 9:45 AM  Performed by: Darcel Early, CRNAPre-anesthesia Checklist: Patient identified, Emergency Drugs available, Suction available, Patient being monitored and Timeout performed Patient Re-evaluated:Patient Re-evaluated prior to induction Oxygen Delivery Method: Circle system utilized Preoxygenation: Pre-oxygenation with 100% oxygen Induction Type: IV induction Ventilation: Mask ventilation without difficulty LMA: LMA inserted LMA Size: 4.0 Number of attempts: 1 Placement Confirmation: positive ETCO2 Tube secured with: Tape Dental Injury: Teeth and Oropharynx as per pre-operative assessment

## 2024-02-07 ENCOUNTER — Encounter (HOSPITAL_BASED_OUTPATIENT_CLINIC_OR_DEPARTMENT_OTHER): Payer: Self-pay | Admitting: Orthopedic Surgery

## 2024-02-07 LAB — SURGICAL PATHOLOGY

## 2024-03-03 ENCOUNTER — Other Ambulatory Visit: Payer: Self-pay | Admitting: Obstetrics & Gynecology

## 2024-03-03 DIAGNOSIS — Z1231 Encounter for screening mammogram for malignant neoplasm of breast: Secondary | ICD-10-CM

## 2024-03-31 ENCOUNTER — Ambulatory Visit

## 2024-04-02 ENCOUNTER — Ambulatory Visit
Admission: RE | Admit: 2024-04-02 | Discharge: 2024-04-02 | Disposition: A | Discharge status: Home / Self Care | Source: Ambulatory Visit | Attending: Obstetrics & Gynecology | Admitting: Obstetrics & Gynecology

## 2024-04-02 DIAGNOSIS — Z1231 Encounter for screening mammogram for malignant neoplasm of breast: Secondary | ICD-10-CM
